# Patient Record
Sex: Male | Born: 1975
Health system: Southern US, Community
[De-identification: ages and names within clinical notes are randomized; demographics above are authoritative.]

## PROBLEM LIST (undated history)

## (undated) DIAGNOSIS — F329 Major depressive disorder, single episode, unspecified: Secondary | ICD-10-CM

## (undated) DIAGNOSIS — N433 Hydrocele, unspecified: Secondary | ICD-10-CM

## (undated) DIAGNOSIS — K219 Gastro-esophageal reflux disease without esophagitis: Secondary | ICD-10-CM

## (undated) DIAGNOSIS — F32A Depression, unspecified: Secondary | ICD-10-CM

## (undated) HISTORY — PX: NO PAST SURGERIES: SHX2092

## (undated) HISTORY — DX: Gastro-esophageal reflux disease without esophagitis: K21.9

---

## 2001-03-30 ENCOUNTER — Encounter: Admission: RE | Admit: 2001-03-30 | Discharge: 2001-03-30 | Payer: Self-pay | Admitting: *Deleted

## 2001-03-30 ENCOUNTER — Encounter: Payer: Self-pay | Admitting: *Deleted

## 2002-06-29 ENCOUNTER — Encounter: Payer: Self-pay | Admitting: Family Medicine

## 2002-06-29 ENCOUNTER — Ambulatory Visit (HOSPITAL_COMMUNITY): Admission: RE | Admit: 2002-06-29 | Discharge: 2002-06-29 | Payer: Self-pay | Admitting: Family Medicine

## 2011-04-21 ENCOUNTER — Emergency Department (HOSPITAL_COMMUNITY)
Admission: EM | Admit: 2011-04-21 | Discharge: 2011-04-21 | Disposition: A | Discharge status: Home / Self Care | Payer: Self-pay | Attending: Emergency Medicine | Admitting: Emergency Medicine

## 2011-04-21 DIAGNOSIS — R209 Unspecified disturbances of skin sensation: Secondary | ICD-10-CM | POA: Insufficient documentation

## 2011-04-21 DIAGNOSIS — R51 Headache: Secondary | ICD-10-CM | POA: Insufficient documentation

## 2011-04-21 DIAGNOSIS — J329 Chronic sinusitis, unspecified: Secondary | ICD-10-CM | POA: Insufficient documentation

## 2012-11-14 ENCOUNTER — Emergency Department (HOSPITAL_COMMUNITY)
Admission: EM | Admit: 2012-11-14 | Discharge: 2012-11-14 | Disposition: A | Discharge status: Home / Self Care | Payer: 59 | Attending: Emergency Medicine | Admitting: Emergency Medicine

## 2012-11-14 ENCOUNTER — Encounter (HOSPITAL_COMMUNITY): Payer: Self-pay

## 2012-11-14 DIAGNOSIS — R51 Headache: Secondary | ICD-10-CM

## 2012-11-14 DIAGNOSIS — Z8669 Personal history of other diseases of the nervous system and sense organs: Secondary | ICD-10-CM | POA: Insufficient documentation

## 2012-11-14 DIAGNOSIS — R040 Epistaxis: Secondary | ICD-10-CM

## 2012-11-14 DIAGNOSIS — F17200 Nicotine dependence, unspecified, uncomplicated: Secondary | ICD-10-CM | POA: Insufficient documentation

## 2012-11-14 DIAGNOSIS — Z79899 Other long term (current) drug therapy: Secondary | ICD-10-CM | POA: Insufficient documentation

## 2012-11-14 MED ORDER — OXYMETAZOLINE HCL 0.05 % NA SOLN: 2.0000 | Freq: Two times a day (BID) | NASAL | Status: DC

## 2012-11-14 NOTE — ED Notes (Signed)
Pt reports headache for 7 days, pain better today, nosebleed yesterday and is now better.

## 2012-11-14 NOTE — ED Provider Notes (Signed)
History   This chart was scribed for Thomas Razor, MD by Sofie Rower, ED Scribe. The patient was seen in room [facility] and the patient's care was started at 10:35AM.     CSN: [id number]  Arrival date & time 11/14/12  0944   First MD Initiated Contact with Patient 11/14/12 1035      Chief Complaint  Patient presents with  . Headache  . Epistaxis    (Consider location/radiation/quality/duration/timing/severity/associated sxs/prior treatment) Patient is a 36 y.o. male presenting with headaches and nosebleeds. The history is provided by the patient. No language interpreter was used.  Headache  This is a new problem. The current episode started more than 2 days ago (7 days ago). The problem occurs constantly. The problem has been gradually worsening. The headache is associated with nothing. The pain is located in the left unilateral region. The pain is moderate. The pain does not radiate. He has tried nothing for the symptoms. The treatment provided no relief.  Epistaxis  This is a new problem. The current episode started yesterday. The problem occurs rarely. The problem has been gradually improving. The problem is associated with an unknown factor. The bleeding has been from the left nare. He has tried nothing for the symptoms. The treatment provided no relief.    Thomas Hobbs is a 36 y.o. male , with a hx of sinus infection (diagnosed one month ago), who presents to the Emergency Department complaining of gradual, progressively worsening, headache, located at the left side of the head, onset one week ago.  Associated symptoms include epistaxis. The pt reports he has been experiencing a constant headache for the past week, culminating in an episode of epistaxis yesterday evening (11/13/12). The episode of epistaxis caused the pt to become concerned and generated a desire to seek evaluation at [facility] today. The pt informs that upon waking up on the morning, he experiences a light headache,  however, it gets progressively worse throughout the day.  The pt denies nausea and abdominal pain.   The pt is a current everyday smoker, in addition to drinking alcohol on an occasional basis.   PCP is Dr. Gerda Diss.    History reviewed. No pertinent past medical history.  History reviewed. No pertinent past surgical history.  No family history on file.  History  Substance Use Topics  . Smoking status: Current Every Day Smoker    Types: Cigarettes  . Smokeless tobacco: Not on file  . Alcohol Use: Yes     Comment: occ      Review of Systems  HENT: Positive for nosebleeds.   Neurological: Positive for headaches.  All other systems reviewed and are negative.    Allergies  Other  Home Medications   Current Outpatient Rx  Name  Route  Sig  Dispense  Refill  . AMOXICILLIN-POT CLAVULANATE 875-125 MG PO TABS   Oral   Take 1 tablet by mouth 2 (two) times daily. Started on Monday 11/09/12. Has had a couple of days in which only one dose was taken. For a 7 day supply         . GUAIFENESIN ER 600 MG PO TB12   Oral   Take 1,200 mg by mouth 2 (two) times daily as needed. For congestion         . NAPROXEN SODIUM 220 MG PO TABS   Oral   Take 220 mg by mouth 2 (two) times daily as needed. For headache         .  SERTRALINE HCL 100 MG PO TABS   Oral   Take 100 mg by mouth daily.           BP 125/63  Pulse 76  Temp 98.3 F (36.8 C) (Oral)  Resp 20  Ht 5\' 5"  (1.651 m)  Wt 155 lb (70.308 kg)  BMI 25.79 kg/m2  SpO2 100%  Physical Exam  Nursing note and vitals reviewed. Constitutional: He appears well-developed and well-nourished. No distress.  HENT:  Head: Normocephalic and atraumatic.       Dried blood within left nostril. No active bleeding.   Eyes: Conjunctivae normal are normal. Right eye exhibits no discharge. Left eye exhibits no discharge.  Neck: Neck supple.  Cardiovascular: Normal rate, regular rhythm and normal heart sounds.  Exam reveals no gallop  and no friction rub.   No murmur heard. Pulmonary/Chest: Effort normal and breath sounds normal. No respiratory distress.  Abdominal: Soft. He exhibits no distension. There is no tenderness.  Musculoskeletal: He exhibits no edema and no tenderness.  Neurological: He is alert.  Skin: Skin is warm and dry.  Psychiatric: He has a normal mood and affect. His behavior is normal. Thought content normal.    ED Course  Procedures (including critical care time)  DIAGNOSTIC STUDIES: Oxygen Saturation is 100% on room air, normal by my interpretation.    COORDINATION OF CARE:  1:13 PM- Treatment plan concerning application of nasal spary discussed with patient. Pt agrees with treatment.      Labs Reviewed - No data to display No results found.   1. Headache   2. Epistaxis       MDM  36 year old male with headache and epistaxis. Epistaxis has resolved without intervention.headache sounds like sinus congestion. Given course of Afrin which should help with both problems. Emergent return precautions were discussed. Outpatient followup otherwise to    I personally preformed the services scribed in my presence. The recorded information has been reviewed and considered. Thomas Razor, MD.     Thomas Razor, MD 11/19/12 [phone]

## 2013-02-12 ENCOUNTER — Ambulatory Visit (INDEPENDENT_AMBULATORY_CARE_PROVIDER_SITE_OTHER): Payer: 59 | Admitting: Nurse Practitioner

## 2013-02-12 ENCOUNTER — Encounter: Payer: Self-pay | Admitting: Nurse Practitioner

## 2013-02-12 VITALS — BP 136/84 | Wt 159.4 lb

## 2013-02-12 DIAGNOSIS — F418 Other specified anxiety disorders: Secondary | ICD-10-CM

## 2013-02-12 DIAGNOSIS — F341 Dysthymic disorder: Secondary | ICD-10-CM

## 2013-02-12 DIAGNOSIS — L259 Unspecified contact dermatitis, unspecified cause: Secondary | ICD-10-CM

## 2013-02-12 MED ORDER — SERTRALINE HCL 50 MG PO TABS: 50.0000 mg | ORAL_TABLET | Freq: Every day | ORAL | Status: DC

## 2013-02-12 MED ORDER — CLOBETASOL PROPIONATE 0.05 % EX CREA: TOPICAL_CREAM | Freq: Two times a day (BID) | CUTANEOUS | Status: DC

## 2013-02-12 MED ORDER — SERTRALINE HCL 25 MG PO TABS: 25.0000 mg | ORAL_TABLET | Freq: Every day | ORAL | Status: DC

## 2013-02-13 ENCOUNTER — Encounter: Payer: Self-pay | Admitting: Nurse Practitioner

## 2013-02-13 DIAGNOSIS — F418 Other specified anxiety disorders: Secondary | ICD-10-CM | POA: Insufficient documentation

## 2013-02-13 DIAGNOSIS — L259 Unspecified contact dermatitis, unspecified cause: Secondary | ICD-10-CM | POA: Insufficient documentation

## 2013-02-13 NOTE — Assessment & Plan Note (Signed)
Feel this is most likely due to contact dermatitis. Doubt infestation if no improvement with clobetasol twice a day after 7-10 days, patient to call back for Elimite cream.

## 2013-02-13 NOTE — Assessment & Plan Note (Signed)
Patient is currently on Zoloft 100 mg. Given prescription for Zoloft 50 mg, take this for 2-3 weeks. If no problems cut dose in half to 25 mg for 2-3 weeks. Given a second prescription for Zoloft 25 mg, if tolerated decrease to half a tab 12.5 mg for 2-3 weeks then discontinue. Patient understands that he still may have some symptoms of serotonin withdrawal syndrome but hopefully these will be minimized. Call back if any problems weaning off medication.

## 2013-02-13 NOTE — Progress Notes (Signed)
Subjective:  Presents for recheck of his depression and anxiety. Has been on Zoloft for years. Recently tried to wean himself off, was down to 50 mg when he stopped. Had intense reaction for couple of days, mentions that he broke a laptop. Went back on Zoloft 100 mg daily. Does not feel he needs the medication any longer. States she feels fine decreasing dosage. Would like to wean off medication at this point. No suicidal/homicidal thoughts or ideation. Also at end of visit mentions a slight rash around the ankle area after wearing a pair of boots during the snowstorm where he was sweating. Pruritic at times.  Objective:   BP 136/84  Wt 159 lb 6.4 oz (72.303 kg)  BMI 26.53 kg/m2 NAD. Alert, oriented. Calm cheerful affect. Lungs clear. Heart regular rate rhythm. A few scattered slightly pink dry areas noted on lower legs.

## 2013-04-14 ENCOUNTER — Telehealth: Payer: Self-pay | Admitting: Family Medicine

## 2013-04-14 NOTE — Telephone Encounter (Signed)
Patient was seen 02/12/2013 for a OV. He wanted message routed to [place] because he states she is monitoring his zoloft.

## 2013-04-14 NOTE — Telephone Encounter (Signed)
Patient would like a refill of his sertraline, but he would like to switch back to the 50 mg.. To [facility] in [place]

## 2013-04-14 NOTE — Telephone Encounter (Signed)
Will be glad to refill; need to know dosage; he was planning to wean off at last visit.

## 2013-04-14 NOTE — Telephone Encounter (Signed)
No. Pt has received multiple cards fr [place] in last four months last ov last June for short term illness,no ov for chronic concerns 22 months!!!! Needs ov

## 2013-04-15 ENCOUNTER — Telehealth: Payer: Self-pay | Admitting: *Deleted

## 2013-04-15 NOTE — Telephone Encounter (Signed)
Left msg to return call to clarify dosage amount of zoloft

## 2013-04-15 NOTE — Telephone Encounter (Signed)
Pt was taking Zoloft  25mg  QD, but had been taking 50mg  in the past. He is asking if he can have a refill on the 50mg .

## 2013-04-17 ENCOUNTER — Other Ambulatory Visit: Payer: Self-pay | Admitting: Nurse Practitioner

## 2013-04-19 ENCOUNTER — Other Ambulatory Visit: Payer: Self-pay | Admitting: Nurse Practitioner

## 2013-04-19 MED ORDER — SERTRALINE HCL 50 MG PO TABS: 50.0000 mg | ORAL_TABLET | Freq: Every day | ORAL | Status: DC

## 2013-04-19 NOTE — Telephone Encounter (Signed)
Refill on Zoloft 50 mg sent in to [facility]

## 2013-04-20 ENCOUNTER — Encounter: Payer: Self-pay | Admitting: Family Medicine

## 2013-04-20 ENCOUNTER — Ambulatory Visit (INDEPENDENT_AMBULATORY_CARE_PROVIDER_SITE_OTHER): Payer: 59 | Admitting: Family Medicine

## 2013-04-20 VITALS — BP 122/78 | Temp 98.7°F | Wt 156.0 lb

## 2013-04-20 DIAGNOSIS — R079 Chest pain, unspecified: Secondary | ICD-10-CM

## 2013-04-20 DIAGNOSIS — M2550 Pain in unspecified joint: Secondary | ICD-10-CM

## 2013-04-20 MED ORDER — MINOCYCLINE HCL 100 MG PO CAPS: 100.0000 mg | ORAL_CAPSULE | Freq: Two times a day (BID) | ORAL | Status: AC

## 2013-04-20 NOTE — Progress Notes (Signed)
  Subjective:    Patient ID: Thomas Hobbs, male    DOB: Apr 17, 1976, 37 y.o.   MRN: [id number]  HPI Strep in the family, both girls.  Pt got achey and sore throat, took abx for a sinus infxn. Took augmentin. f u cx was negative, felt better.  Diminished energy, light headed. No fever, tho felt like.  Muscle aches and joint aches.no tick bites, felt one on him. Not bitten  Muscle strenth a little better. achey in arm, off and on.  Felt panic attacks at night.  Chest pain, right ant semi sharp, constant, worse when leaning forward. Still smoking  Weaned down on zoloft to 25--got more anxious as numbers dropped. Review of Systems No vomiting measurable fever no change in bowel habits no known tick bite. Hasson 6 on no    Objective:   Physical Exam  Alert no acute distress HEENT normal. Vital stable. Lungs clear. Heart regular rate and rhythm. Abdomen soft. Flanged a bit during exam but otherwise completely normal no rebound no guarding. Skin no rash.      Assessment & Plan:  Impression multitude of symptoms likely related to #2 and #3 #2 panic attacks anxiety with element of serotonin withdrawal. Patient just increase his Zoloft #3 arthralgias myalgias diminished energy likely viral. However will cover with tetracycline to be safe. 25 minutes spent with patient most in discussion. Plan maintain higher dose of Zoloft. Add minocycline 100 mg twice a day. Symptomatic care discussed. WSL

## 2013-10-18 ENCOUNTER — Other Ambulatory Visit: Payer: Self-pay | Admitting: Nurse Practitioner

## 2014-01-08 ENCOUNTER — Other Ambulatory Visit: Payer: Self-pay | Admitting: Family Medicine

## 2014-02-10 ENCOUNTER — Encounter: Payer: Self-pay | Admitting: Family Medicine

## 2014-02-10 ENCOUNTER — Ambulatory Visit (INDEPENDENT_AMBULATORY_CARE_PROVIDER_SITE_OTHER): Payer: 59 | Admitting: Family Medicine

## 2014-02-10 VITALS — BP 136/82 | Temp 98.9°F | Ht 65.0 in | Wt 153.4 lb

## 2014-02-10 DIAGNOSIS — J019 Acute sinusitis, unspecified: Secondary | ICD-10-CM

## 2014-02-10 DIAGNOSIS — J111 Influenza due to unidentified influenza virus with other respiratory manifestations: Secondary | ICD-10-CM

## 2014-02-10 MED ORDER — AMOXICILLIN-POT CLAVULANATE 875-125 MG PO TABS: 1.0000 | ORAL_TABLET | Freq: Two times a day (BID) | ORAL | Status: DC

## 2014-02-10 MED ORDER — SERTRALINE HCL 50 MG PO TABS: ORAL_TABLET | ORAL | Status: DC

## 2014-02-10 NOTE — Progress Notes (Signed)
   Subjective:    Patient ID: Thomas Hobbs, male    DOB: 03/28/1976, 38 y.o.   MRN: [id number]  HPI Patient arrives to follow up from urgent care. Patient diagnosed with flu and given tamiflu. Fever is better but still having pressure in head and burning in chest. Patient relates using the urgent care diagnosed with the flu now he is having more frontal sinus headache pains and feels fatigued and tired PMH benign  Review of Systems  Constitutional: Negative for fever and activity change.  HENT: Positive for congestion and rhinorrhea. Negative for ear pain.   Eyes: Negative for discharge.  Respiratory: Positive for cough. Negative for wheezing.   Cardiovascular: Negative for chest pain.       Objective:   Physical Exam  Nursing note and vitals reviewed. Constitutional: He appears well-developed.  HENT:  Head: Normocephalic.  Mouth/Throat: Oropharynx is clear and moist. No oropharyngeal exudate.  Neck: Normal range of motion.  Cardiovascular: Normal rate, regular rhythm and normal heart sounds.   No murmur heard. Pulmonary/Chest: Effort normal and breath sounds normal. He has no wheezes.  Lymphadenopathy:    He has no cervical adenopathy.  Neurological: He exhibits normal muscle tone.  Skin: Skin is warm and dry.    Moderate frontal sinus tenderness      Assessment & Plan:  Influenza-finished Tamiflu no sign of pneumonia currently Rhinosinusitis antibiotics prescribed should gradually help this Patient is to rest through the weekend followup if fevers difficulty breathing or worse.

## 2014-02-16 ENCOUNTER — Encounter: Payer: Self-pay | Admitting: Family Medicine

## 2014-02-16 ENCOUNTER — Ambulatory Visit (INDEPENDENT_AMBULATORY_CARE_PROVIDER_SITE_OTHER): Payer: 59 | Admitting: Family Medicine

## 2014-02-16 VITALS — BP 116/86 | Temp 98.6°F | Ht 65.0 in | Wt 150.0 lb

## 2014-02-16 DIAGNOSIS — K219 Gastro-esophageal reflux disease without esophagitis: Secondary | ICD-10-CM

## 2014-02-16 DIAGNOSIS — J329 Chronic sinusitis, unspecified: Secondary | ICD-10-CM

## 2014-02-16 DIAGNOSIS — J310 Chronic rhinitis: Secondary | ICD-10-CM

## 2014-02-16 MED ORDER — CEFPROZIL 500 MG PO TABS: 500.0000 mg | ORAL_TABLET | Freq: Two times a day (BID) | ORAL | Status: DC

## 2014-02-16 MED ORDER — ONDANSETRON 4 MG PO TBDP: 4.0000 mg | ORAL_TABLET | Freq: Four times a day (QID) | ORAL | Status: DC | PRN

## 2014-02-16 NOTE — Progress Notes (Signed)
   Subjective:    Patient ID: Thomas Hobbs, male    DOB: 04/28/1976, 38 y.o.   MRN: [id number]  Emesis  This is a new problem. The current episode started in the past 7 days. The problem has been unchanged. The emesis has an appearance of stomach contents. Maximum temperature: 99.5. Associated symptoms include myalgias. Associated symptoms comments: Fatigue, loss of appetite, nausea. Treatments tried: augmentin. The treatment provided no relief.  Patient states that he has no other concerns at this time.   Nausea achey  diminishe dappetite  Temp low gr very small mon and last night  Took augmentin    not sure if aug adding to things  Loosening up finally in chest  Left side  Review of Systems  Gastrointestinal: Positive for vomiting.  Musculoskeletal: Positive for myalgias.   diminished energy somewhat diminished appetite ROS otherwise negative     Objective:   Physical Exam  Alert mild malaise. Hydration adequate. No orthostatic changes. H&T moderate his congestion lungs bronchial cough clear heart regular rhythm mild epigastric tenderness.      Assessment & Plan:  Impression post flu bronchitis sinusitis with GI element. Also Augmentin may be adding to reflux like symptoms. Plan stop Augmentin change antibiotic. Zofran when necessary for nausea. Take over-the-counter antacid encourage liquids expect slow resolution. WSL

## 2014-02-28 ENCOUNTER — Encounter: Payer: Self-pay | Admitting: Family Medicine

## 2014-02-28 ENCOUNTER — Ambulatory Visit (INDEPENDENT_AMBULATORY_CARE_PROVIDER_SITE_OTHER): Payer: 59 | Admitting: Family Medicine

## 2014-02-28 VITALS — BP 126/82 | Temp 99.0°F | Ht 64.0 in | Wt 150.0 lb

## 2014-02-28 DIAGNOSIS — J310 Chronic rhinitis: Secondary | ICD-10-CM

## 2014-02-28 DIAGNOSIS — J329 Chronic sinusitis, unspecified: Secondary | ICD-10-CM

## 2014-02-28 MED ORDER — LEVOFLOXACIN 500 MG PO TABS: 500.0000 mg | ORAL_TABLET | Freq: Every day | ORAL | Status: DC

## 2014-02-28 NOTE — Progress Notes (Signed)
   Subjective:    Patient ID: Thomas Hobbs, male    DOB: 05/27/1976, 38 y.o.   MRN: [id number]  Sinusitis This is a new problem. The current episode started 1 to 4 weeks ago. (Ringing in ears, headache, dizzy, pressure in head, shaky) Treatments tried: antibiotics.   Seen on 04/1. Prescribed cefzil. Took for 9 days and then started itching, red streak on face.   On day 8 or 9 developed sig itching on the back and legs  Took the med and developed itching and rash  Also had flushing after taking the meds  Usually does not get spring allergies  Stress level high, split up form spouse  Impacting on sleep etc, and maybe gave more stress Review of Systems No vomiting no diarrhea no rash minimal cough ROS otherwise negative    Objective:   Physical Exam  Alert hydration good. HEENT moderate his congestion. Plus minus frontal tenderness. Rashes dissipated pharynx normal neck supple. Lungs clear heart regular in rhythm.      Assessment & Plan:  #1 probable rhinosinusitis #2 probable Cefzil allergy discussed #3 stress level increase wi recent separation from spouseth plan Levaquin daily 10 days. Encouraged to cut down smoking. Regular exercise encourage.

## 2014-03-22 ENCOUNTER — Encounter: Payer: Self-pay | Admitting: Family Medicine

## 2014-03-22 ENCOUNTER — Ambulatory Visit (INDEPENDENT_AMBULATORY_CARE_PROVIDER_SITE_OTHER): Payer: 59 | Admitting: Family Medicine

## 2014-03-22 VITALS — BP 118/72 | Ht 64.0 in | Wt 152.2 lb

## 2014-03-22 DIAGNOSIS — H9319 Tinnitus, unspecified ear: Secondary | ICD-10-CM

## 2014-03-22 DIAGNOSIS — R251 Tremor, unspecified: Secondary | ICD-10-CM

## 2014-03-22 DIAGNOSIS — R259 Unspecified abnormal involuntary movements: Secondary | ICD-10-CM

## 2014-03-22 MED ORDER — SERTRALINE HCL 100 MG PO TABS: 100.0000 mg | ORAL_TABLET | Freq: Every day | ORAL | Status: DC

## 2014-03-22 MED ORDER — CLONAZEPAM 1 MG PO TABS: 1.0000 mg | ORAL_TABLET | Freq: Every day | ORAL | Status: DC

## 2014-03-22 MED ORDER — KETOCONAZOLE 2 % EX CREA: 1.0000 "application " | TOPICAL_CREAM | Freq: Two times a day (BID) | CUTANEOUS | Status: DC

## 2014-03-22 NOTE — Progress Notes (Signed)
   Subjective:    Patient ID: Thomas Hobbs States, male    DOB: 11/06/1976, 38 y.o.   MRN: [id number]  HPI  Patient arrives to discuss his nerves. Patient states he recently separated from wife and noticed he is having spells where his hands shake and he gets jittery and his ears ring. Patient stated he is having significant anxiety.  Very sig jitterine ess and shakiness, feels shaky.  Pt states occas gets anxious and nervous. Often has jumpiness and shakiness with this.  Major tremor with anxiety  Went thru counselling several yrs ago, husband "states that he needs to pack up and go" cause it "won't work".  Filed for full custody Hobbs few minutes ago.   Patient also reports Hobbs rash on his right thigh.   Review of Systems  headache no chest back pain no abdominal pain no change in bowel habits    Objective:   Physical Exam  Alert no apparent distress vitals stable lungs clear. Heart rare rhythm. Slightly tremulous on exam. Accentuated with focus. Tenia cruris evident.      Assessment & Plan:  Impression tremor with anxiety. #2 tenia corporis discussed plan appropriate intervention ketoconazole. Klonopin when necessary. Serotonin reuptake inhibitor diet exercise discussed. WSL

## 2014-03-28 ENCOUNTER — Telehealth: Payer: Self-pay | Admitting: Family Medicine

## 2014-03-28 NOTE — Telephone Encounter (Signed)
Patient says that the klonopin that he was prescribed for tremors helped him for the first two days. He said he took them at around 9pm and woke up feeling great, but since then he has been a nervous wreck. Please advise.  [facility] [place]

## 2014-03-28 NOTE — Telephone Encounter (Signed)
Patient scheduled office visit this week for re check

## 2014-03-28 NOTE — Telephone Encounter (Signed)
appt later this wk

## 2014-03-30 ENCOUNTER — Ambulatory Visit (INDEPENDENT_AMBULATORY_CARE_PROVIDER_SITE_OTHER): Payer: 59 | Admitting: Family Medicine

## 2014-03-30 ENCOUNTER — Encounter: Payer: Self-pay | Admitting: Family Medicine

## 2014-03-30 VITALS — BP 128/70 | Ht 64.0 in | Wt 151.8 lb

## 2014-03-30 DIAGNOSIS — G252 Other specified forms of tremor: Secondary | ICD-10-CM

## 2014-03-30 DIAGNOSIS — G250 Essential tremor: Secondary | ICD-10-CM

## 2014-03-30 MED ORDER — PROPRANOLOL HCL 40 MG PO TABS: 40.0000 mg | ORAL_TABLET | Freq: Two times a day (BID) | ORAL | Status: DC

## 2014-03-30 NOTE — Progress Notes (Signed)
   Subjective:    Patient ID: Thomas Hobbs, male    DOB: 10/11/1976, 38 y.o.   MRN: [id number]  HPIFollow up on Tremors. Taking klonopin. Not helping.   Primarily not helping.  Felt much better after klonopin. On the third day, has noted zero improvement  No sig notice of change with increased zoloft  Working ok, when trying to write has hard time writing.  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  Prior to major stresses of late--seperated--no sig tremor.  No known fam hx of tremor    Review of Systems No chest pain no headache no back pain no abdominal pain no loss of consciousness ROS otherwise negative    Objective:   Physical Exam Alert somewhat anxious appearing. HEENT normal. Lungs clear heart regular in rhythm. HEENT revealed fine tremor accentuated when spoken about       Assessment & Plan:  Impression essential tremor with exacerbation secondary to anxiety plan add propanolol 40 twice a day. Maintain Klonopin each bedtime when necessary for sleep. Symptomatic care discussed. Followup as scheduled. WSL

## 2014-03-31 DIAGNOSIS — G250 Essential tremor: Secondary | ICD-10-CM | POA: Insufficient documentation

## 2014-08-03 ENCOUNTER — Other Ambulatory Visit: Payer: Self-pay | Admitting: Family Medicine

## 2014-09-14 ENCOUNTER — Other Ambulatory Visit: Payer: Self-pay | Admitting: *Deleted

## 2014-09-14 ENCOUNTER — Telehealth: Payer: Self-pay | Admitting: Family Medicine

## 2014-09-14 MED ORDER — SERTRALINE HCL 100 MG PO TABS: ORAL_TABLET | ORAL | Status: DC

## 2014-09-14 NOTE — Telephone Encounter (Signed)
Med sent to pharm. Pt notified.  

## 2014-09-14 NOTE — Telephone Encounter (Signed)
Pt needs refill on his sertraline (ZOLOFT) 100 MG tablet, he has appointment here on 10/04/14 for a physical, can we give enough until then?  Pt uses [facility] Please call pt when done

## 2014-10-04 ENCOUNTER — Ambulatory Visit (INDEPENDENT_AMBULATORY_CARE_PROVIDER_SITE_OTHER): Payer: 59 | Admitting: Family Medicine

## 2014-10-04 ENCOUNTER — Encounter: Payer: Self-pay | Admitting: Family Medicine

## 2014-10-04 VITALS — BP 124/76 | HR 70 | Ht 64.5 in | Wt 153.0 lb

## 2014-10-04 DIAGNOSIS — Z23 Encounter for immunization: Secondary | ICD-10-CM

## 2014-10-04 DIAGNOSIS — L723 Sebaceous cyst: Secondary | ICD-10-CM

## 2014-10-04 DIAGNOSIS — Z0000 Encounter for general adult medical examination without abnormal findings: Secondary | ICD-10-CM

## 2014-10-04 MED ORDER — SERTRALINE HCL 100 MG PO TABS: ORAL_TABLET | ORAL | Status: DC

## 2014-10-04 NOTE — Progress Notes (Signed)
   Subjective:    Patient ID: Thomas Hobbs, male    DOB: 03/26/1976, 38 y.o.   MRN: [id number]  HPI The patient comes in today for a wellness visit.    A review of their health history was completed.  A review of medications was also completed.  Any needed refills; yes refill on zoloft  Eating habits: not health conscious  Falls/  MVA accidents in past few months: no  Regular exercise: none  Specialist pt sees on regular basis: none  Preventative health issues were discussed.   Additional concerns: none. flu vaccine today.  Not exercising  Diet not so good  Needs p e thru work  Kids doing good,  Smoking one and a half ppd,  Strong fam hx of cad, al smoked  Pt had sharp cp, and very tender to touch and sore, got better  On itds own  No colon ca no prost ca  Once per night  Took proptanolol for awhile did not seem to help  Klonopin now on ocas for sleep   Patient concerned that he gets multiple skin cysts. Frustrated by. Review of Systems  Constitutional: Negative for fever, activity change and appetite change.  HENT: Negative for congestion and rhinorrhea.   Eyes: Negative for discharge.  Respiratory: Negative for cough and wheezing.   Cardiovascular: Negative for chest pain.  Gastrointestinal: Negative for vomiting, abdominal pain and blood in stool.  Genitourinary: Negative for frequency and difficulty urinating.  Musculoskeletal: Negative for neck pain.  Skin: Negative for rash.  Allergic/Immunologic: Negative for environmental allergies and food allergies.  Neurological: Negative for weakness and headaches.  Psychiatric/Behavioral: Negative for agitation.  All other systems reviewed and are negative.      Objective:   Physical Exam  Constitutional: He appears well-developed and well-nourished.  HENT:  Head: Normocephalic and atraumatic.  Right Ear: External ear normal.  Left Ear: External ear normal.  Nose: Nose normal.  Mouth/Throat:  Oropharynx is clear and moist.  Eyes: EOM are normal. Pupils are equal, round, and reactive to light.  Neck: Normal range of motion. Neck supple. No thyromegaly present.  Cardiovascular: Normal rate, regular rhythm and normal heart sounds.   No murmur heard. Pulmonary/Chest: Effort normal and breath sounds normal. No respiratory distress. He has no wheezes.  Abdominal: Soft. Bowel sounds are normal. He exhibits no distension and no mass. There is no tenderness.  Genitourinary: Penis normal.  Musculoskeletal: Normal range of motion. He exhibits no edema.  Lymphadenopathy:    He has no cervical adenopathy.  Neurological: He is alert. He exhibits normal muscle tone.  Skin: Skin is warm and dry. No erythema.  Psychiatric: He has a normal mood and affect. His behavior is normal. Judgment normal.  Vitals reviewed.  Multiple sebaceous cyst noted on back groin. Some with recent inflammatory changes.       Assessment & Plan:  Impression #1wellness exam #2 depression clinically stable #3 frequent skin cystsplan appropriate blood work. Diet exercise discussed. Meds refilled. Check every 6 months. Dermatology referral. Maceo ProWSL

## 2014-10-11 LAB — HEPATIC FUNCTION PANEL
ALK PHOS: 100 U/L (ref 39–117)
ALT: 8 U/L (ref 0–53)
AST: 15 U/L (ref 0–37)
Albumin: 4.4 g/dL (ref 3.5–5.2)
BILIRUBIN DIRECT: 0.1 mg/dL (ref 0.0–0.3)
BILIRUBIN INDIRECT: 0.4 mg/dL (ref 0.2–1.2)
BILIRUBIN TOTAL: 0.5 mg/dL (ref 0.2–1.2)
Total Protein: 6.8 g/dL (ref 6.0–8.3)

## 2014-10-11 LAB — BASIC METABOLIC PANEL
BUN: 14 mg/dL (ref 6–23)
CO2: 27 meq/L (ref 19–32)
CREATININE: 0.78 mg/dL (ref 0.50–1.35)
Calcium: 9.2 mg/dL (ref 8.4–10.5)
Chloride: 104 mEq/L (ref 96–112)
Glucose, Bld: 89 mg/dL (ref 70–99)
Potassium: 4.3 mEq/L (ref 3.5–5.3)
SODIUM: 137 meq/L (ref 135–145)

## 2014-10-11 LAB — LIPID PANEL
CHOL/HDL RATIO: 5.4 ratio
CHOLESTEROL: 193 mg/dL (ref 0–200)
HDL: 36 mg/dL — ABNORMAL LOW (ref >39)
LDL Cholesterol: 132 mg/dL — ABNORMAL HIGH (ref 0–99)
Triglycerides: 126 mg/dL (ref <150)
VLDL: 25 mg/dL (ref 0–40)

## 2014-10-15 ENCOUNTER — Encounter: Payer: Self-pay | Admitting: Family Medicine

## 2014-11-02 ENCOUNTER — Encounter: Payer: Self-pay | Admitting: Family Medicine

## 2014-11-02 ENCOUNTER — Ambulatory Visit: Payer: 59 | Admitting: Family Medicine

## 2014-11-04 ENCOUNTER — Ambulatory Visit (INDEPENDENT_AMBULATORY_CARE_PROVIDER_SITE_OTHER): Payer: 59 | Admitting: Family Medicine

## 2014-11-04 ENCOUNTER — Encounter: Payer: Self-pay | Admitting: Family Medicine

## 2014-11-04 VITALS — BP 110/74 | Temp 98.4°F | Wt 155.0 lb

## 2014-11-04 DIAGNOSIS — K219 Gastro-esophageal reflux disease without esophagitis: Secondary | ICD-10-CM

## 2014-11-04 MED ORDER — PANTOPRAZOLE SODIUM 40 MG PO TBEC: 40.0000 mg | DELAYED_RELEASE_TABLET | Freq: Every day | ORAL | Status: DC

## 2014-11-04 NOTE — Progress Notes (Signed)
   Subjective:    Patient ID: Thomas Hobbs, male    DOB: 05/21/1976, 38 y.o.   MRN: [id number]  Chest Pain  This is a new problem. The current episode started 1 to 4 weeks ago. Associated symptoms include abdominal pain. Associated symptoms comments: bloating. Treatments tried: pepcid. The treatment provided moderate relief.    Heartburn for yrs uses pepcid complete, takes care of it  Recent constip  Sig belching and burping an consitpation  Noted midepigast pain off and on     Review of Systems  Cardiovascular: Positive for chest pain.  Gastrointestinal: Positive for abdominal pain.       Objective:   Physical Exam   Alert no acute distress vital stable lungs clear heart regular in rhythm abdomen mild epigastric tenderness     Assessment & Plan:  Impression reflux element of gastritis plan cut down caffeine. Local measures discussed. Protonix daily then when necessary. WSL

## 2014-11-05 DIAGNOSIS — K219 Gastro-esophageal reflux disease without esophagitis: Secondary | ICD-10-CM | POA: Insufficient documentation

## 2014-12-03 ENCOUNTER — Other Ambulatory Visit: Payer: Self-pay | Admitting: Family Medicine

## 2015-03-03 ENCOUNTER — Ambulatory Visit (INDEPENDENT_AMBULATORY_CARE_PROVIDER_SITE_OTHER): Payer: 59 | Admitting: Nurse Practitioner

## 2015-03-03 ENCOUNTER — Encounter: Payer: Self-pay | Admitting: Nurse Practitioner

## 2015-03-03 VITALS — BP 122/82 | Temp 98.2°F | Ht 64.5 in | Wt 157.4 lb

## 2015-03-03 DIAGNOSIS — J329 Chronic sinusitis, unspecified: Secondary | ICD-10-CM | POA: Diagnosis not present

## 2015-03-03 MED ORDER — AZITHROMYCIN 250 MG PO TABS: ORAL_TABLET | ORAL | Status: DC

## 2015-03-03 MED ORDER — METHYLPREDNISOLONE ACETATE 40 MG/ML IJ SUSP
40.0000 mg | Freq: Once | INTRAMUSCULAR | Status: AC
  Administered 2015-03-03: 40 mg via INTRAMUSCULAR

## 2015-03-06 ENCOUNTER — Encounter: Payer: Self-pay | Admitting: Nurse Practitioner

## 2015-03-06 NOTE — Progress Notes (Signed)
Subjective:  Presents for c/o congestion and cough x 11 d. Had some Doxycycline left over from previous visit. Has started this with no relief. Also using flonase. No fever. Sore throat resolved. Facial area headache. Green nasal drainage. Non productive cough. Ear pressure. Minimal wheezing.   Objective:   BP 122/82 mmHg  Temp(Src) 98.2 F (36.8 C) (Oral)  Ht 5' 4.5" (1.638 m)  Wt 157 lb 6.4 oz (71.396 kg)  BMI 26.61 kg/m2 NAD. Alert, oriented. TMs retracted, no erythema. Pharynx injected with green PND noted. Neck supple with mild anterior adenopathy. Lungs clear. Heart RRR.,  Assessment: Rhinosinusitis - Plan: methylPREDNISolone acetate (DEPO-MEDROL) injection 40 mg  Plan:  Meds ordered this encounter  Medications  . azithromycin (ZITHROMAX Z-PAK) 250 MG tablet    Sig: Take 2 tablets (500 mg) on  Day 1,  followed by 1 tablet (250 mg) once daily on Days 2 through 5.    Dispense:  6 each    Refill:  0    Order Specific Question:  Supervising Provider    Answer:  Merlyn AlbertLUKING, WILLIAM S [[id number]]  . methylPREDNISolone acetate (DEPO-MEDROL) injection 40 mg    Sig:    OTC meds as directed. Call back next week if no improvement, sooner if worse. Discussed smoking cessation.

## 2015-03-12 ENCOUNTER — Other Ambulatory Visit: Payer: Self-pay | Admitting: Family Medicine

## 2015-04-17 ENCOUNTER — Other Ambulatory Visit: Payer: Self-pay | Admitting: Family Medicine

## 2015-04-18 NOTE — Telephone Encounter (Signed)
Needs office visit.

## 2015-05-17 ENCOUNTER — Other Ambulatory Visit: Payer: Self-pay | Admitting: Family Medicine

## 2015-05-18 MED ORDER — SERTRALINE HCL 100 MG PO TABS: 100.0000 mg | ORAL_TABLET | Freq: Every day | ORAL | Status: DC

## 2015-05-18 NOTE — Telephone Encounter (Signed)
Needs office visit.

## 2015-05-18 NOTE — Addendum Note (Signed)
Addended by: Margaretha SheffieldBROWN, Mckyla Deckman S on: 05/18/2015 11:01 AM   Modules accepted: Orders

## 2015-06-15 ENCOUNTER — Other Ambulatory Visit: Payer: Self-pay | Admitting: Family Medicine

## 2015-07-16 ENCOUNTER — Other Ambulatory Visit: Payer: Self-pay | Admitting: Family Medicine

## 2015-07-17 NOTE — Telephone Encounter (Signed)
Ok two ref 

## 2015-08-28 ENCOUNTER — Encounter: Payer: Self-pay | Admitting: Family Medicine

## 2015-08-28 ENCOUNTER — Ambulatory Visit (INDEPENDENT_AMBULATORY_CARE_PROVIDER_SITE_OTHER): Payer: 59 | Admitting: Family Medicine

## 2015-08-28 VITALS — BP 124/76 | Temp 98.7°F | Ht 64.5 in | Wt 154.0 lb

## 2015-08-28 DIAGNOSIS — L03314 Cellulitis of groin: Secondary | ICD-10-CM | POA: Diagnosis not present

## 2015-08-28 MED ORDER — DOXYCYCLINE HYCLATE 100 MG PO TABS: 100.0000 mg | ORAL_TABLET | Freq: Two times a day (BID) | ORAL | Status: DC

## 2015-08-28 MED ORDER — MUPIROCIN 2 % EX OINT: TOPICAL_OINTMENT | CUTANEOUS | Status: DC

## 2015-08-28 NOTE — Progress Notes (Signed)
   Subjective:    Patient ID: Thomas Hobbs, male    DOB: 01/10/1976, 39 y.o.   MRN: [id number]  HPI Patient states that he has a cyst to left inner thigh near groin area. Patient states that it has drained some yellowish green pus.   Has hx of recurrent cysts  Saw dr hall in past and was offerred med but did not take  Has a knot and cyst on scrotal sack  Patient states that he also has rash in this area.  Review of Systems No fever no chills no change in urinary or bowel habits    Objective:   Physical Exam Alert vitals stable. Lungs clear heart regular in rhythm. Groin multiple inflamed cyst scrotal region inflamed cyst no fluctuance positive discharge tender erythema positive induration       Assessment & Plan:  Impression secondarily infected cyst plan local measures discussed. Abdomen Bactroban topically. Dr. see 100 twice a day 10 days. Symptom care discussed WSL

## 2015-10-17 ENCOUNTER — Telehealth: Payer: Self-pay | Admitting: Family Medicine

## 2015-10-17 DIAGNOSIS — Z13220 Encounter for screening for lipoid disorders: Secondary | ICD-10-CM

## 2015-10-17 DIAGNOSIS — Z139 Encounter for screening, unspecified: Secondary | ICD-10-CM

## 2015-10-17 DIAGNOSIS — Z79899 Other long term (current) drug therapy: Secondary | ICD-10-CM

## 2015-10-17 DIAGNOSIS — R5383 Other fatigue: Secondary | ICD-10-CM

## 2015-10-17 NOTE — Telephone Encounter (Signed)
Calling to get BW ordered for upcoming physical appointment.

## 2015-10-17 NOTE — Telephone Encounter (Signed)
Lipid, liver, CBC, metabolic 7

## 2015-10-18 ENCOUNTER — Other Ambulatory Visit: Payer: Self-pay | Admitting: Family Medicine

## 2015-10-18 NOTE — Telephone Encounter (Signed)
Orders ready. Pt notified.  

## 2015-10-18 NOTE — Telephone Encounter (Signed)
May have this and one refill, needs office visit for this specific medicine

## 2015-11-16 ENCOUNTER — Ambulatory Visit (INDEPENDENT_AMBULATORY_CARE_PROVIDER_SITE_OTHER): Payer: 59 | Admitting: Family Medicine

## 2015-11-16 ENCOUNTER — Ambulatory Visit (HOSPITAL_COMMUNITY)
Admission: RE | Admit: 2015-11-16 | Discharge: 2015-11-16 | Disposition: A | Discharge status: Home / Self Care | Payer: 59 | Source: Ambulatory Visit | Attending: Family Medicine | Admitting: Family Medicine

## 2015-11-16 ENCOUNTER — Encounter: Payer: Self-pay | Admitting: Family Medicine

## 2015-11-16 VITALS — BP 110/74 | Ht 65.0 in | Wt 153.4 lb

## 2015-11-16 DIAGNOSIS — Z13220 Encounter for screening for lipoid disorders: Secondary | ICD-10-CM | POA: Diagnosis not present

## 2015-11-16 DIAGNOSIS — F418 Other specified anxiety disorders: Secondary | ICD-10-CM

## 2015-11-16 DIAGNOSIS — M2550 Pain in unspecified joint: Secondary | ICD-10-CM

## 2015-11-16 DIAGNOSIS — Z79899 Other long term (current) drug therapy: Secondary | ICD-10-CM

## 2015-11-16 DIAGNOSIS — R109 Unspecified abdominal pain: Secondary | ICD-10-CM

## 2015-11-16 DIAGNOSIS — F329 Major depressive disorder, single episode, unspecified: Secondary | ICD-10-CM | POA: Diagnosis not present

## 2015-11-16 DIAGNOSIS — R5383 Other fatigue: Secondary | ICD-10-CM | POA: Diagnosis not present

## 2015-11-16 DIAGNOSIS — M79642 Pain in left hand: Secondary | ICD-10-CM | POA: Diagnosis present

## 2015-11-16 DIAGNOSIS — M79671 Pain in right foot: Secondary | ICD-10-CM | POA: Insufficient documentation

## 2015-11-16 DIAGNOSIS — Z0000 Encounter for general adult medical examination without abnormal findings: Secondary | ICD-10-CM | POA: Diagnosis not present

## 2015-11-16 DIAGNOSIS — F32A Depression, unspecified: Secondary | ICD-10-CM

## 2015-11-16 MED ORDER — SERTRALINE HCL 100 MG PO TABS: 100.0000 mg | ORAL_TABLET | Freq: Every day | ORAL | Status: DC

## 2015-11-16 NOTE — Progress Notes (Signed)
   Subjective:    Patient ID: Thomas Hobbs, male    DOB: 1976-01-22, 39 y.o.   MRN: [id number]  HPI The patient comes in today for a wellne  A review of their health history was completed.  A review of medications was also completed.  Any needed refills: zoloft. Medications reviewed today. Does not miss a dose. States overall its helping his depression. Does not do well when he skips a dose. Full compliance noted.  Eating habits: good  Falls/ MVA accidents in past few months: 1 fall (patient works on houses)   Regular exercise: no  Specialist pt sees on regular basis: none  Preventative health issues were discussed.   Additional concerns: Joint pain, muscle pain, weakness, sinus issues  Lot of exosure to mold and not feeling as good. Pressure sens in the head.  Takes zoloft faithfully, does nt think it is doing anything  Six months fatigue, work going well, not overtime   Exercise not regular, pt does not do that  Flu shot not given  Thyr hx in family, in mother , on supplentn  Watching fats in diet loves fats   Neg crippling arthr hx  Right habded      Review of Systems  Constitutional: Negative for fever, activity change and appetite change.  HENT: Negative for congestion and rhinorrhea.   Eyes: Negative for discharge.  Respiratory: Negative for cough and wheezing.   Cardiovascular: Negative for chest pain.  Gastrointestinal: Negative for vomiting, abdominal pain and blood in stool.  Genitourinary: Negative for frequency and difficulty urinating.  Musculoskeletal: Negative for neck pain.  Skin: Negative for rash.  Allergic/Immunologic: Negative for environmental allergies and food allergies.  Neurological: Negative for weakness and headaches.  Psychiatric/Behavioral: Negative for agitation.  All other systems reviewed and are negative.      Objective:   Physical Exam  Constitutional: He appears well-developed and well-nourished.  HENT:  Head:  Normocephalic and atraumatic.  Right Ear: External ear normal.  Left Ear: External ear normal.  Nose: Nose normal.  Mouth/Throat: Oropharynx is clear and moist.  Eyes: EOM are normal. Pupils are equal, round, and reactive to light.  Neck: Normal range of motion. Neck supple. No thyromegaly present.  Cardiovascular: Normal rate, regular rhythm and normal heart sounds.   No murmur heard. Pulmonary/Chest: Effort normal and breath sounds normal. No respiratory distress. He has no wheezes.  Abdominal: Soft. Bowel sounds are normal. He exhibits no distension and no mass. There is no tenderness.  Genitourinary: Penis normal.  Musculoskeletal: Normal range of motion. He exhibits no edema.  Lymphadenopathy:    He has no cervical adenopathy.  Neurological: He is alert. He exhibits normal muscle tone.  Skin: Skin is warm and dry. No erythema.  Psychiatric: He has a normal mood and affect. His behavior is normal. Judgment normal.  Vitals reviewed.         Assessment & Plan:  Impression 1 well until exam #2 depression clinically stable. Medications reviewed today. Importance of compliance discussed. Plan exercise diet discussed. Of note towards end of visit began to discuss myriad of problems including chronic right hip pain chronic epigastric discomfort achiness in joints and primarily left hand, and chronic headache. I advised we would do some initial labs x-rays etc. see patient for 10.visit in several weeks. Diet exercise discussed. Screening tests discussed WSL

## 2015-11-22 LAB — LIPID PANEL
Chol/HDL Ratio: 5.1 ratio — ABNORMAL HIGH (ref 0.0–5.0)
Cholesterol, Total: 213 mg/dL — ABNORMAL HIGH (ref 100–199)
HDL: 42 mg/dL
LDL Calculated: 154 mg/dL — ABNORMAL HIGH (ref 0–99)
Triglycerides: 86 mg/dL (ref 0–149)
VLDL Cholesterol Cal: 17 mg/dL (ref 5–40)

## 2015-11-22 LAB — SEDIMENTATION RATE: SED RATE: 2 mm/h (ref 0–15)

## 2015-11-22 LAB — CBC WITH DIFFERENTIAL/PLATELET
BASOS: 1 %
Basophils Absolute: 0.1 10*3/uL (ref 0.0–0.2)
EOS (ABSOLUTE): 0.4 10*3/uL (ref 0.0–0.4)
EOS: 6 %
HEMATOCRIT: 46.9 % (ref 37.5–51.0)
HEMOGLOBIN: 15.9 g/dL (ref 12.6–17.7)
IMMATURE GRANS (ABS): 0 10*3/uL (ref 0.0–0.1)
IMMATURE GRANULOCYTES: 1 %
Lymphocytes Absolute: 2.9 10*3/uL (ref 0.7–3.1)
Lymphs: 38 %
MCH: 30.1 pg (ref 26.6–33.0)
MCHC: 33.9 g/dL (ref 31.5–35.7)
MCV: 89 fL (ref 79–97)
MONOCYTES: 7 %
Monocytes Absolute: 0.5 10*3/uL (ref 0.1–0.9)
NEUTROS PCT: 47 %
Neutrophils Absolute: 3.5 10*3/uL (ref 1.4–7.0)
Platelets: 251 10*3/uL (ref 150–379)
RBC: 5.29 x10E6/uL (ref 4.14–5.80)
RDW: 13.5 % (ref 12.3–15.4)
WBC: 7.4 10*3/uL (ref 3.4–10.8)

## 2015-11-22 LAB — BASIC METABOLIC PANEL
BUN/Creatinine Ratio: 15 (ref 9–20)
BUN: 12 mg/dL (ref 6–24)
CO2: 25 mmol/L (ref 18–29)
CREATININE: 0.79 mg/dL (ref 0.76–1.27)
Calcium: 9.4 mg/dL (ref 8.7–10.2)
Chloride: 101 mmol/L (ref 96–106)
GFR calc Af Amer: 129 mL/min/{1.73_m2} (ref >59)
GFR, EST NON AFRICAN AMERICAN: 112 mL/min/{1.73_m2} (ref >59)
GLUCOSE: 88 mg/dL (ref 65–99)
Potassium: 4.3 mmol/L (ref 3.5–5.2)
Sodium: 138 mmol/L (ref 134–144)

## 2015-11-22 LAB — HEPATIC FUNCTION PANEL
ALBUMIN: 4.4 g/dL (ref 3.5–5.5)
ALT: 13 IU/L (ref 0–44)
AST: 20 IU/L (ref 0–40)
Alkaline Phosphatase: 97 IU/L (ref 39–117)
BILIRUBIN, DIRECT: 0.09 mg/dL (ref 0.00–0.40)
Bilirubin Total: 0.3 mg/dL (ref 0.0–1.2)
TOTAL PROTEIN: 6.4 g/dL (ref 6.0–8.5)

## 2015-11-22 LAB — HELICOBACTER PYLORI ABS-IGG+IGA, BLD: H Pylori IgG: <0.9 U/mL (ref 0.0–0.8)

## 2015-11-22 LAB — TSH: TSH: 2.09 u[IU]/mL (ref 0.450–4.500)

## 2015-11-22 LAB — RHEUMATOID FACTOR

## 2015-11-29 ENCOUNTER — Encounter: Payer: Self-pay | Admitting: Family Medicine

## 2015-11-29 ENCOUNTER — Ambulatory Visit (INDEPENDENT_AMBULATORY_CARE_PROVIDER_SITE_OTHER): Payer: 59 | Admitting: Family Medicine

## 2015-11-29 VITALS — BP 118/78 | Ht 65.0 in | Wt 158.6 lb

## 2015-11-29 DIAGNOSIS — K219 Gastro-esophageal reflux disease without esophagitis: Secondary | ICD-10-CM

## 2015-11-29 DIAGNOSIS — G44229 Chronic tension-type headache, not intractable: Secondary | ICD-10-CM

## 2015-11-29 DIAGNOSIS — M79671 Pain in right foot: Secondary | ICD-10-CM | POA: Diagnosis not present

## 2015-11-29 DIAGNOSIS — M79642 Pain in left hand: Secondary | ICD-10-CM

## 2015-11-29 MED ORDER — PANTOPRAZOLE SODIUM 40 MG PO TBEC: 40.0000 mg | DELAYED_RELEASE_TABLET | Freq: Every day | ORAL | Status: DC

## 2015-11-29 MED ORDER — CHLORZOXAZONE 500 MG PO TABS: ORAL_TABLET | ORAL | Status: DC

## 2015-11-29 NOTE — Patient Instructions (Addendum)
Results for orders placed or performed in visit on 11/16/15  Lipid panel  Result Value Ref Range   Cholesterol, Total 213 (H) 100 - 199 mg/dL   Triglycerides 86 0 - 149 mg/dL   HDL 42 >39 mg/dL   VLDL Cholesterol Cal 17 5 - 40 mg/dL   LDL Calculated 154 (H) 0 - 99 mg/dL   Chol/HDL Ratio 5.1 (H) 0.0 - 5.0 ratio units  Hepatic function panel  Result Value Ref Range   Total Protein 6.4 6.0 - 8.5 g/dL   Albumin 4.4 3.5 - 5.5 g/dL   Bilirubin Total 0.3 0.0 - 1.2 mg/dL   Bilirubin, Direct 0.09 0.00 - 0.40 mg/dL   Alkaline Phosphatase 97 39 - 117 IU/L   AST 20 0 - 40 IU/L   ALT 13 0 - 44 IU/L  Basic metabolic panel  Result Value Ref Range   Glucose 88 65 - 99 mg/dL   BUN 12 6 - 24 mg/dL   Creatinine, Ser 0.79 0.76 - 1.27 mg/dL   GFR calc non Af Amer 112 >59 mL/min/1.73   GFR calc Af Amer 129 >59 mL/min/1.73   BUN/Creatinine Ratio 15 9 - 20   Sodium 138 134 - 144 mmol/L   Potassium 4.3 3.5 - 5.2 mmol/L   Chloride 101 96 - 106 mmol/L   CO2 25 18 - 29 mmol/L   Calcium 9.4 8.7 - 10.2 mg/dL  CBC with Differential/Platelet  Result Value Ref Range   WBC 7.4 3.4 - 10.8 x10E3/uL   RBC 5.29 4.14 - 5.80 x10E6/uL   Hemoglobin 15.9 12.6 - 17.7 g/dL   Hematocrit 46.9 37.5 - 51.0 %   MCV 89 79 - 97 fL   MCH 30.1 26.6 - 33.0 pg   MCHC 33.9 31.5 - 35.7 g/dL   RDW 13.5 12.3 - 15.4 %   Platelets 251 150 - 379 x10E3/uL   Neutrophils 47 %   Lymphs 38 %   Monocytes 7 %   Eos 6 %   Basos 1 %   Neutrophils Absolute 3.5 1.4 - 7.0 x10E3/uL   Lymphocytes Absolute 2.9 0.7 - 3.1 x10E3/uL   Monocytes Absolute 0.5 0.1 - 0.9 x10E3/uL   EOS (ABSOLUTE) 0.4 0.0 - 0.4 x10E3/uL   Basophils Absolute 0.1 0.0 - 0.2 x10E3/uL   Immature Granulocytes 1 %   Immature Grans (Abs) 0.0 0.0 - 0.1 x10E3/uL  TSH  Result Value Ref Range   TSH 2.090 0.450 - 4.500 uIU/mL  Rheumatoid Factor  Result Value Ref Range   Rhuematoid fact SerPl-aCnc <10.0 0.0 - 13.9 IU/mL  Sed Rate (ESR)  Result Value Ref Range   Sed Rate  2 0 - 15 mm/hr  Helicobacter pylori abs-IgG+IgA, bld  Result Value Ref Range   H. pylori, IgA Abs <9.0 0.0 - 8.9 units   H Pylori IgG <0.9 0.0 - 0.8 U/mL   High Cholesterol High cholesterol refers to having a high level of cholesterol in your blood. Cholesterol is a white, waxy, fat-like protein that your body needs in small amounts. Your liver makes all the cholesterol you need. Excess cholesterol comes from the food you eat. Cholesterol travels in your bloodstream through your blood vessels. If you have high cholesterol, deposits (plaque) may build up on the walls of your blood vessels. This makes the arteries narrower and stiffer. Plaque increases your risk of heart attack and stroke. Work with your health care provider to keep your cholesterol levels in a healthy range. RISK FACTORS Several  things can make you more likely to have high cholesterol. These include:   Eating foods high in animal fat (saturated fat) or cholesterol.  Being overweight.  Not getting enough exercise.  Having a family history of high cholesterol. SIGNS AND SYMPTOMS High cholesterol does not cause symptoms. DIAGNOSIS  Your health care provider can do a blood test to check whether you have high cholesterol. If you are older than 20, your health care provider may check your cholesterol every 4-6 years. You may be checked more often if you already have high cholesterol or other risk factors for heart disease. The blood test for cholesterol measures the following:  Bad cholesterol (LDL cholesterol). This is the type of cholesterol that causes heart disease. This number should be less than 100.  Good cholesterol (HDL cholesterol). This type helps protect against heart disease. A healthy level of HDL cholesterol is 60 or higher.  Total cholesterol. This is the combined number of LDL cholesterol and HDL cholesterol. A healthy number is less than 200. TREATMENT  High cholesterol can be treated with diet changes,  lifestyle changes, and medicine.   Diet changes may include eating more whole grains, fruits, vegetables, nuts, and fish. You may also have to cut back on red meat and foods with a lot of added sugar.  Lifestyle changes may include getting at least 40 minutes of aerobic exercise three times a week. Aerobic exercises include walking, biking, and swimming. Aerobic exercise along with a healthy diet can help you maintain a healthy weight. Lifestyle changes may also include quitting smoking.  If diet and lifestyle changes are not enough to lower your cholesterol, your health care provider may prescribe a statin medicine. This medicine has been shown to lower cholesterol and also lower the risk of heart disease. HOME CARE INSTRUCTIONS  Only take over-the-counter or prescription medicines as directed by your health care provider.   Follow a healthy diet as directed by your health care provider. For instance:   Eat chicken (without skin), fish, veal, shellfish, ground [place] breast, and round or loin cuts of red meat.  Do not eat fried foods and fatty meats, such as hot dogs and salami.   Eat plenty of fruits, such as apples.   Eat plenty of vegetables, such as broccoli, potatoes, and carrots.   Eat beans, peas, and lentils.   Eat grains, such as barley, rice, couscous, and bulgur wheat.   Eat pasta without cream sauces.   Use skim or nonfat milk and low-fat or nonfat yogurt and cheeses. Do not eat or drink whole milk, cream, ice cream, egg yolks, and hard cheeses.   Do not eat stick margarine or tub margarines that contain trans fats (also called partially hydrogenated oils).   Do not eat cakes, cookies, crackers, or other baked goods that contain trans fats.   Do not eat saturated tropical oils, such as coconut and palm oil.   Exercise as directed by your health care provider. Increase your activity level with activities such as gardening or walking.   Keep all follow-up  appointments.  SEEK MEDICAL CARE IF:  You are struggling to maintain a healthy diet or weight.  You need help starting an exercise program.  You need help to stop smoking. SEEK IMMEDIATE MEDICAL CARE IF:  You have chest pain.  You have trouble breathing.   This information is not intended to replace advice given to you by your health care provider. Make sure you discuss any questions you have with  your health care provider.   Document Released: 11/04/2005 Document Revised: 11/25/2014 Document Reviewed: 08/27/2013 Elsevier Interactive Patient Education [employer].

## 2015-11-29 NOTE — Progress Notes (Signed)
Subjective:    Patient ID: Thomas Hobbs, male    DOB: 1976/03/12, 40 y.o.   MRN: [id number] Patient arrives for follow-up of tremendous number of concerns HPI  Patient arrives to follow up on headaches. Been getting them for year at least. 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 Uses naproxen or ibuprofen, sometimes does not help but usually does  Still having intermittent epigastric discomfrot ,feels a fullness and feels like a knot.worsens after eating . History of reflux. Does take anti-inflammatory meds as noted.  Clinching at work leads to pain in the hand but not doing excessive work outs . Pain primarily in the thumbs.   and discuss recent blood work and x rays.  Results for orders placed or performed in visit on 11/16/15  Lipid panel  Result Value Ref Range   Cholesterol, Total 213 (H) 100 - 199 mg/dL   Triglycerides 86 0 - 149 mg/dL   HDL 42 >39 mg/dL   VLDL Cholesterol Cal 17 5 - 40 mg/dL   LDL Calculated 154 (H) 0 - 99 mg/dL   Chol/HDL Ratio 5.1 (H) 0.0 - 5.0 ratio units  Hepatic function panel  Result Value Ref Range   Total Protein 6.4 6.0 - 8.5 g/dL   Albumin 4.4 3.5 - 5.5 g/dL   Bilirubin Total 0.3 0.0 - 1.2 mg/dL   Bilirubin, Direct 0.09 0.00 - 0.40 mg/dL   Alkaline Phosphatase 97 39 - 117 IU/L   AST 20 0 - 40 IU/L   ALT 13 0 - 44 IU/L  Basic metabolic panel  Result Value Ref Range   Glucose 88 65 - 99 mg/dL   BUN 12 6 - 24 mg/dL   Creatinine, Ser 0.79 0.76 - 1.27 mg/dL   GFR calc non Af Amer 112 >59 mL/min/1.73   GFR calc Af Amer 129 >59 mL/min/1.73   BUN/Creatinine Ratio 15 9 - 20   Sodium 138 134 - 144 mmol/L   Potassium 4.3 3.5 - 5.2 mmol/L   Chloride 101 96 - 106 mmol/L   CO2 25 18 - 29 mmol/L   Calcium 9.4 8.7 - 10.2 mg/dL  CBC with Differential/Platelet  Result Value Ref Range   WBC 7.4 3.4 - 10.8 x10E3/uL   RBC 5.29 4.14 - 5.80 x10E6/uL   Hemoglobin 15.9 12.6 - 17.7 g/dL   Hematocrit 46.9 37.5 - 51.0 %   MCV 89 79 - 97 fL   MCH 30.1 26.6 - 33.0 pg   MCHC 33.9 31.5 - 35.7 g/dL   RDW 13.5 12.3 - 15.4 %   Platelets 251 150 - 379 x10E3/uL   Neutrophils 47 %   Lymphs 38 %   Monocytes 7 %   Eos 6 %   Basos 1 %   Neutrophils Absolute 3.5 1.4 - 7.0 x10E3/uL   Lymphocytes Absolute 2.9 0.7 - 3.1 x10E3/uL   Monocytes Absolute 0.5 0.1 - 0.9 x10E3/uL   EOS (ABSOLUTE) 0.4 0.0 - 0.4 x10E3/uL   Basophils Absolute 0.1 0.0 - 0.2 x10E3/uL   Immature Granulocytes 1 %   Immature Grans (Abs) 0.0 0.0 - 0.1 x10E3/uL  TSH  Result Value Ref Range   TSH 2.090 0.450 - 4.500 uIU/mL  Rheumatoid Factor  Result Value Ref Range   Rhuematoid fact SerPl-aCnc <10.0 0.0 - 13.9 IU/mL  Sed Rate (ESR)  Result Value Ref Range   Sed Rate 2 0 -  15 mm/hr  Helicobacter pylori abs-IgG+IgA, bld  Result Value Ref Range   H. pylori, IgA Abs <9.0 0.0 - 8.9 units   H Pylori IgG <0.9 0.0 - 0.8 U/mL   Admits to dietary noncompliance. A lot of fatty greasy foods etc.  Right foot pain chronic basic metabolic first metacarpal phalangeal joint. No true gout like symptoms chronic pain nearly daily for the past year recalls no prior injury  Uses otc omeprazole Review of Systems No chest pain no back pain abdominal pain no change in bowel habits review systems otherwise negative    Objective:   Physical Exam  Alert vital stable neck supple HEENT normal lungs clear heart rare rhythm mild epigastric tenderness hands good grip no deformities right foot some pain deep palpation first metacarpal phalangeal joint      Assessment & Plan:  Impression tension headaches discussed at length regular exercise stress reduction advised add chlorzoxazone when necessary #2 reflux with now no minimal gastritis discussed will increase her Protonix daily try to cut back on anti-inflammatories No. 3 foot pain likely arthritis she x-ray result #4 hand pain likely tendinitis  work-related symptom care discussed plan chlorzoxazone prescribed. Protonic prescribed exercise encourage encouraged to cut down on fat intake WSL

## 2015-12-07 ENCOUNTER — Telehealth: Payer: Self-pay | Admitting: Family Medicine

## 2015-12-07 NOTE — Telephone Encounter (Signed)
Patient has had diarrhea, stomach pains, gas, slight nausea since Monday.  Can we call something in to help his diarrhea?      [facility]

## 2015-12-07 NOTE — Telephone Encounter (Signed)
Discussed with patient. Patient advised otc immodium is al that is rec for this. Patient verbalized understanding and will call back to schedule appt.if ongoing problems.

## 2015-12-07 NOTE — Telephone Encounter (Signed)
otc immodium is al that is rec for this

## 2015-12-08 ENCOUNTER — Ambulatory Visit (INDEPENDENT_AMBULATORY_CARE_PROVIDER_SITE_OTHER): Payer: 59 | Admitting: Nurse Practitioner

## 2015-12-08 ENCOUNTER — Encounter: Payer: Self-pay | Admitting: Nurse Practitioner

## 2015-12-08 VITALS — BP 120/80 | Temp 98.6°F | Ht 65.0 in | Wt 154.2 lb

## 2015-12-08 DIAGNOSIS — R197 Diarrhea, unspecified: Secondary | ICD-10-CM | POA: Diagnosis not present

## 2015-12-08 NOTE — Patient Instructions (Signed)
Bowel probiotic: align as directed

## 2015-12-10 ENCOUNTER — Encounter: Payer: Self-pay | Admitting: Nurse Practitioner

## 2015-12-10 NOTE — Progress Notes (Signed)
Subjective:  Presents for c/o intermittent diarrhea for the past 5 days. Had frequent diarrhea the first day 1/16; gradually improved then watery again yesterday; more formed today; has had 2 stools. No fever, some chills. No blood in stool. Mild mid upper epigastric area discomfort at times. Started on Protonix on 1/11 for acid reflux which has been stable. Slight nausea, no vomiting. Taking fluids well. Voiding nl. Smoker. Drinks a large amount of caffeine. No alcohol or NSAID use. Has been eating a regular diet this week including greasy foods.   Objective:   BP 120/80 mmHg  Temp(Src) 98.6 F (37 C) (Oral)  Ht  (1.651 m)  Wt 154 lb 4 oz (69.967 kg)  BMI 25.67 kg/m2 NAD. Alert, oriented. Lungs clear. Heart RRR. Abdomen soft, non distended, with active BS x 4. Minimal epigastric area tenderness going into RUQ. No obvious masses. No rebound or guarding. Labs dated 11/17/15 show normal liver enzymes.   Assessment: Diarrhea, unspecified type  Plan: start bowel probiotic. Reviewed dietary measures for diarrhea. Encouraged patient to cut back on caffeine and tobacco use. Continue Protonix as directed. Warning signs reviewed. Call back in 4-5 days if no improvement, sooner if worse.

## 2016-01-02 ENCOUNTER — Telehealth: Payer: Self-pay | Admitting: Family Medicine

## 2016-01-02 NOTE — Telephone Encounter (Signed)
Ov later this wk to foucuas only on abdomen

## 2016-01-02 NOTE — Telephone Encounter (Signed)
Spoke with patient and to discuss symptoms. Patient has c/o of abdominal pain below sternum radiating to left and right sides of abdomen, tender to the touch, excessive gas, and states he has weird growling sound from stomach. Bowel movements are normal. Patient states that he has been taking Protonix as prescribed but is still having the same discomfort. Would like to know what is recommended next. Please advise?

## 2016-01-02 NOTE — Telephone Encounter (Signed)
Spoke with patient and informed him per Dr.Steve Luking- Office visit later this week to focus only on abdominal pain. Patient verbalized understanding and was transferred to front desk to schedule appointment.

## 2016-01-02 NOTE — Telephone Encounter (Signed)
Pt called stating that he is still having problems with his stomach and is wanting to know if he should get an ultrasound or something to see if there is anything else that can be done. Please advise.

## 2016-01-03 ENCOUNTER — Encounter: Payer: Self-pay | Admitting: Family Medicine

## 2016-01-03 ENCOUNTER — Ambulatory Visit (INDEPENDENT_AMBULATORY_CARE_PROVIDER_SITE_OTHER): Payer: 59 | Admitting: Family Medicine

## 2016-01-03 VITALS — BP 122/80 | Temp 98.6°F | Ht 64.0 in | Wt 159.0 lb

## 2016-01-03 DIAGNOSIS — R197 Diarrhea, unspecified: Secondary | ICD-10-CM | POA: Diagnosis not present

## 2016-01-03 DIAGNOSIS — K219 Gastro-esophageal reflux disease without esophagitis: Secondary | ICD-10-CM | POA: Diagnosis not present

## 2016-01-03 DIAGNOSIS — R109 Unspecified abdominal pain: Secondary | ICD-10-CM

## 2016-01-03 NOTE — Progress Notes (Signed)
   Subjective:    Patient ID: Thomas Hobbs, male    DOB: [birth year], 40 y.o.   MRN: [id number]  Abdominal Pain This is a recurrent problem. The pain is located in the epigastric region. Nothing aggravates the pain. The pain is relieved by nothing.   Diarrhea has calmed down  Reflux is better  On zoloft long term no gi symptoms number has caused problem in the past.  Notes persistent luq tenderness and right mid abd tend  pts wondering if more than just gastritis and esophagit  Often accompanied by sustantial gas  No major change in diet, pt had spicey foods and did not chang e the discomfor   Patient has smoked for 30 years worried about more potential serious etiology to his pain.  Notes ongoing stress.  No fever or chills. Pain somewhat worse post prandially can occur any time  Review of Systems  Gastrointestinal: Positive for abdominal pain.   negative urine symptoms. No dysuria no chest pain ROS otherwise negative     Objective:   Physical Exam  Alert vital stable HEENT normal. Lungs clear. Heart rare rhythm abdomen excellent bowel sounds mild epigastric tenderness no rebound no guarding no masses      Assessment & Plan:  Impression long-standing abdominal pain now worsening. Long discussion held. Minimal responsive proton pump inhibitors patient worried about something more serious with his progressive pain plan GI referral rationale discussed 25 minutes spent most in discussion maintain same meds of note negative for H. pylori

## 2016-01-08 ENCOUNTER — Encounter: Payer: Self-pay | Admitting: Family Medicine

## 2016-01-09 ENCOUNTER — Encounter (INDEPENDENT_AMBULATORY_CARE_PROVIDER_SITE_OTHER): Payer: Self-pay | Admitting: *Deleted

## 2016-02-02 ENCOUNTER — Ambulatory Visit (INDEPENDENT_AMBULATORY_CARE_PROVIDER_SITE_OTHER): Payer: 59 | Admitting: Internal Medicine

## 2016-02-27 ENCOUNTER — Encounter: Payer: Self-pay | Admitting: Family Medicine

## 2016-02-27 ENCOUNTER — Ambulatory Visit (INDEPENDENT_AMBULATORY_CARE_PROVIDER_SITE_OTHER): Payer: 59 | Admitting: Family Medicine

## 2016-02-27 VITALS — BP 134/86 | Temp 98.5°F | Ht 64.0 in | Wt 160.0 lb

## 2016-02-27 DIAGNOSIS — J329 Chronic sinusitis, unspecified: Secondary | ICD-10-CM | POA: Diagnosis not present

## 2016-02-27 MED ORDER — CLARITHROMYCIN 500 MG PO TABS
500.0000 mg | ORAL_TABLET | Freq: Two times a day (BID) | ORAL | Status: AC
Start: 2016-02-27 — End: 2016-03-12

## 2016-02-27 NOTE — Progress Notes (Signed)
   Subjective:    Patient ID: Thomas Hobbs, male    DOB: 06/19/1976, 40 y.o.   MRN: [id number]  HPISinus burning after using saline solution. Happened 14 hours ago.   Month ago started getting cold and sinus infxn an d other symptoms  [facility], [facility] not [facility]x  sinuxs pressure since then  Usually uses saline  Bad irritation and burning after using the nasal saline  Still has pressure  Usually gets sl cold in thre spring   Had systemic symptoms early on         Review of Systems No headache, no major weight loss or weight gain, no chest pain no back pain abdominal pain no change in bowel habits complete ROS otherwise negative     Objective:   Physical Exam   Alert, mild malaise. Hydration good Vitals stable. frontal/ maxillary tenderness evident positive nasal congestion. pharynx normal neck supple  lungs clear/no crackles or wheezes. heart regular in rhythm      Assessment & Plan:  Impression rhinosinusitis likely post viral, discussed with patient. plan antibiotics prescribed. Questions answered. Symptomatic care discussed. warning signs discussed. WSL

## 2016-05-15 ENCOUNTER — Other Ambulatory Visit: Payer: Self-pay | Admitting: Family Medicine

## 2016-05-22 ENCOUNTER — Encounter (INDEPENDENT_AMBULATORY_CARE_PROVIDER_SITE_OTHER): Payer: Self-pay | Admitting: *Deleted

## 2016-05-22 ENCOUNTER — Ambulatory Visit (INDEPENDENT_AMBULATORY_CARE_PROVIDER_SITE_OTHER): Payer: 59 | Admitting: Internal Medicine

## 2016-05-22 ENCOUNTER — Encounter (INDEPENDENT_AMBULATORY_CARE_PROVIDER_SITE_OTHER): Payer: Self-pay | Admitting: Internal Medicine

## 2016-05-22 ENCOUNTER — Other Ambulatory Visit (INDEPENDENT_AMBULATORY_CARE_PROVIDER_SITE_OTHER): Payer: Self-pay | Admitting: Internal Medicine

## 2016-05-22 VITALS — BP 90/50 | HR 60 | Temp 98.0°F | Ht 64.0 in | Wt 156.5 lb

## 2016-05-22 DIAGNOSIS — K219 Gastro-esophageal reflux disease without esophagitis: Secondary | ICD-10-CM

## 2016-05-22 NOTE — Progress Notes (Signed)
   Subjective:    Patient ID: Thomas Hobbs, male    DOB: 01/20/1976, 40 y.o.   MRN: [id number]  HPI Referred by Dr. Lubertha SouthSteve Luking for epigastric pain. He has had the pain x 1 year off and on.  The epigastric pain occurs in cycles.  For the past 2-3 weeks the pain has been worse.  Foods does not make it worse.  He has acid reflux 4-5 times a week. He burps frequently. At times he has stomach acid to come up into his mouth. He usually has a BM x 1 day . He says he is regular.  For the most part, his appetite is good.  He tells me he had a black stool x 1 last week when his stomach was bothering him.  No NSAIDs.      Review of Systems Past Medical History  Diagnosis Date  . GERD (gastroesophageal reflux disease)     No past surgical history on file.  Allergies  Allergen Reactions  . Augmentin [Amoxicillin-Pot Clavulanate] Nausea Only  . Cefzil [Cefprozil] Itching  . Levaquin [Levofloxacin In D5w]     Chest tightness, dizziness, lightheaded, myalgia  . Other Other (See Comments)    Patient states he is allergic to an antibiotic, unsure of name - possibly clindamycin (numbness and swelling to one side of face)    Current Outpatient Prescriptions on File Prior to Visit  Medication Sig Dispense Refill  . pantoprazole (PROTONIX) 40 MG tablet Take 1 tablet (40 mg total) by mouth daily. 30 tablet 5  . sertraline (ZOLOFT) 100 MG tablet TAKE 1 TABLET(100 MG) BY MOUTH DAILY 30 tablet 0   No current facility-administered medications on file prior to visit.        Objective:   Physical ExamBlood pressure 90/50, pulse 60, temperature 98 F (36.7 C), height 5\' 4"  (1.626 m), weight 156 lb 8 oz (70.988 kg). Alert and oriented. Skin warm and dry. Oral mucosa is moist.   . Sclera anicteric, conjunctivae is pink. Thyroid not enlarged. No cervical lymphadenopathy. Lungs clear. Heart regular rate and rhythm.  Abdomen is soft. Bowel sounds are positive. No hepatomegaly. No abdominal masses  felt. No tenderness.  No edema to lower extremities.          Assessment & Plan:  GERD.  PUD needs to be ruled out.  EGD. The risks and benefits such as perforation, bleeding, and infection were reviewed with the patient and is agreeable.

## 2016-05-22 NOTE — Patient Instructions (Signed)
The risks and benefits such as perforation, bleeding, and infection were reviewed with the patient and is agreeable. 

## 2016-05-23 ENCOUNTER — Other Ambulatory Visit: Payer: Self-pay | Admitting: Family Medicine

## 2016-06-10 ENCOUNTER — Ambulatory Visit (INDEPENDENT_AMBULATORY_CARE_PROVIDER_SITE_OTHER): Payer: 59 | Admitting: Family Medicine

## 2016-06-10 ENCOUNTER — Encounter: Payer: Self-pay | Admitting: Family Medicine

## 2016-06-10 VITALS — BP 122/82 | Ht 64.0 in | Wt 156.6 lb

## 2016-06-10 DIAGNOSIS — N433 Hydrocele, unspecified: Secondary | ICD-10-CM

## 2016-06-10 NOTE — Patient Instructions (Signed)
Hydrocele, Adult  A hydrocele is a collection of fluid in the loose pouch of skin that holds the testicles (scrotum). Usually, it affects only one testicle.  CAUSES  This condition may be caused by:  · An injury to the scrotum.  · An infection.  · A tumor or cancer of the testicle.  · Twisting of a testicle.  · Decreased blood flow to the scrotum.  SYMPTOMS  A hydrocele feels like a water-filled balloon. It may also feel heavy. A hydrocele can cause:  · Swelling of the scrotum. The swelling may decrease when you lie down.  · Swelling of the groin.  · Mild discomfort in the scrotum.  · Pain. This can develop if the hydrocele was caused by infection or twisting.  DIAGNOSIS  This condition may be diagnosed with a medical history, physical exam, and imaging tests. You may also have blood and urine tests to check for infection.  TREATMENT  Treatment may include:  · Watching and waiting, particularly if the hydrocele causes no symptoms.  · Treatment of the underlying condition. This may include using antibiotic medicine.  · Surgery to drain the fluid. Some surgical options include:    Needle aspiration. For this procedure, a needle is used to drain fluid.    Hydrocelectomy. For this procedure, an incision is made in the scrotum to remove the fluid sac.  HOME CARE INSTRUCTIONS  · Keep all follow-up visits as told by your health care provider. This is important.  · Watch the hydrocele for any changes.  · Take over-the-counter and prescription medicines only as told by your health care provider.  · If you were prescribed an antibiotic medicine, use it as told by your health care provider. Do not stop using the antibiotic even if your condition improves.  SEEK MEDICAL CARE IF:  · The swelling in your scrotum or groin gets worse.  · The hydrocele becomes red, firm, tender to the touch, or painful.  · You notice any changes in the hydrocele.  · You have a fever.     This information is not intended to replace advice given to  you by your health care provider. Make sure you discuss any questions you have with your health care provider.     Document Released: 04/24/2010 Document Revised: 03/21/2015 Document Reviewed: 10/31/2014  Elsevier Interactive Patient Education ©2016 [employer]

## 2016-06-10 NOTE — Progress Notes (Signed)
   Subjective:    Patient ID: Thomas Hobbs, male    DOB: 06-Apr-1976, 40 y.o.   MRN: [id number]  HPI  Patient arrives with enlarged testicle-first noticed on Friday but unsure exactly how long it has been going on. Patient reports no pain.  Feels slight pressure  No dysuria no pain  [phone]  Allied urology, preferred to do in house u s  Review of Systems No headache, no major weight loss or weight gain, no chest pain no back pain abdominal pain no change in bowel habits complete ROS otherwise negative     Objective:   Physical Exam   Alert vitals stable, NAD. Blood pressure good on repeat. HEENT normal. Lungs clear. Heart regular rate and rhythm. Right testicle hydrocele palpated. No obvious inguinal mass.     Assessment & Plan:  Impression spontaneous hydrocele plan urology referral rationale discussed [facility]

## 2016-06-11 ENCOUNTER — Encounter: Payer: Self-pay | Admitting: Family Medicine

## 2016-06-17 ENCOUNTER — Other Ambulatory Visit: Payer: Self-pay | Admitting: Family Medicine

## 2016-07-02 ENCOUNTER — Other Ambulatory Visit: Payer: Self-pay | Admitting: Urology

## 2016-07-02 ENCOUNTER — Ambulatory Visit (INDEPENDENT_AMBULATORY_CARE_PROVIDER_SITE_OTHER): Payer: 59 | Admitting: Urology

## 2016-07-02 ENCOUNTER — Ambulatory Visit (HOSPITAL_COMMUNITY)
Admission: RE | Admit: 2016-07-02 | Discharge: 2016-07-02 | Disposition: A | Discharge status: Home / Self Care | Payer: 59 | Source: Ambulatory Visit | Attending: Urology | Admitting: Urology

## 2016-07-02 DIAGNOSIS — N433 Hydrocele, unspecified: Secondary | ICD-10-CM

## 2016-07-14 ENCOUNTER — Encounter (HOSPITAL_COMMUNITY): Payer: Self-pay | Admitting: Emergency Medicine

## 2016-07-14 ENCOUNTER — Emergency Department (HOSPITAL_COMMUNITY)
Admission: EM | Admit: 2016-07-14 | Discharge: 2016-07-14 | Disposition: A | Discharge status: Home / Self Care | Payer: 59 | Attending: Dermatology | Admitting: Dermatology

## 2016-07-14 DIAGNOSIS — Z5321 Procedure and treatment not carried out due to patient leaving prior to being seen by health care provider: Secondary | ICD-10-CM | POA: Diagnosis not present

## 2016-07-14 DIAGNOSIS — F17210 Nicotine dependence, cigarettes, uncomplicated: Secondary | ICD-10-CM | POA: Diagnosis not present

## 2016-07-14 DIAGNOSIS — N433 Hydrocele, unspecified: Secondary | ICD-10-CM | POA: Diagnosis not present

## 2016-07-14 NOTE — ED Notes (Signed)
NO ANSWER WHEN CALLED TO PATIENT ROOM

## 2016-07-14 NOTE — ED Triage Notes (Signed)
Reports has been dx with right sided hydrocele and has been having increased pain over the last 3 days into lower abdomen.  Denies increased swelling.

## 2016-07-16 ENCOUNTER — Telehealth: Payer: Self-pay | Admitting: Family Medicine

## 2016-07-16 ENCOUNTER — Other Ambulatory Visit: Payer: Self-pay | Admitting: Family Medicine

## 2016-07-16 NOTE — Telephone Encounter (Signed)
Patient called and asked if we could aspirate his hydrocele.  I advised him to call his Urologist as we do not do that here.

## 2016-07-23 ENCOUNTER — Other Ambulatory Visit: Payer: Self-pay | Admitting: Family Medicine

## 2016-07-24 ENCOUNTER — Encounter (HOSPITAL_COMMUNITY)
Admission: RE | Disposition: A | Discharge status: Home / Self Care | Payer: Self-pay | Source: Ambulatory Visit | Attending: Internal Medicine

## 2016-07-24 ENCOUNTER — Encounter (HOSPITAL_COMMUNITY): Payer: Self-pay | Admitting: *Deleted

## 2016-07-24 ENCOUNTER — Ambulatory Visit (HOSPITAL_COMMUNITY)
Admission: RE | Admit: 2016-07-24 | Discharge: 2016-07-24 | Disposition: A | Discharge status: Home / Self Care | Payer: 59 | Source: Ambulatory Visit | Attending: Internal Medicine | Admitting: Internal Medicine

## 2016-07-24 DIAGNOSIS — F329 Major depressive disorder, single episode, unspecified: Secondary | ICD-10-CM | POA: Diagnosis not present

## 2016-07-24 DIAGNOSIS — K219 Gastro-esophageal reflux disease without esophagitis: Secondary | ICD-10-CM

## 2016-07-24 DIAGNOSIS — R1013 Epigastric pain: Secondary | ICD-10-CM | POA: Insufficient documentation

## 2016-07-24 DIAGNOSIS — K449 Diaphragmatic hernia without obstruction or gangrene: Secondary | ICD-10-CM | POA: Insufficient documentation

## 2016-07-24 DIAGNOSIS — K2950 Unspecified chronic gastritis without bleeding: Secondary | ICD-10-CM | POA: Diagnosis not present

## 2016-07-24 DIAGNOSIS — F17210 Nicotine dependence, cigarettes, uncomplicated: Secondary | ICD-10-CM | POA: Diagnosis not present

## 2016-07-24 DIAGNOSIS — K228 Other specified diseases of esophagus: Secondary | ICD-10-CM | POA: Diagnosis not present

## 2016-07-24 DIAGNOSIS — Z79899 Other long term (current) drug therapy: Secondary | ICD-10-CM | POA: Insufficient documentation

## 2016-07-24 DIAGNOSIS — K2970 Gastritis, unspecified, without bleeding: Secondary | ICD-10-CM | POA: Diagnosis not present

## 2016-07-24 HISTORY — DX: Major depressive disorder, single episode, unspecified: F32.9

## 2016-07-24 HISTORY — PX: ESOPHAGOGASTRODUODENOSCOPY: SHX5428

## 2016-07-24 HISTORY — DX: Hydrocele, unspecified: N43.3

## 2016-07-24 HISTORY — PX: BIOPSY: SHX5522

## 2016-07-24 HISTORY — DX: Depression, unspecified: F32.A

## 2016-07-24 SURGERY — EGD (ESOPHAGOGASTRODUODENOSCOPY)
Anesthesia: Moderate Sedation

## 2016-07-24 MED ORDER — BUTAMBEN-TETRACAINE-BENZOCAINE 2-2-14 % EX AERO
INHALATION_SPRAY | CUTANEOUS | Status: DC | PRN
  Administered 2016-07-24: 1 via TOPICAL

## 2016-07-24 MED ORDER — SODIUM CHLORIDE 0.9% FLUSH
INTRAVENOUS | Status: AC
  Filled 2016-07-24: qty 10

## 2016-07-24 MED ORDER — MEPERIDINE HCL 50 MG/ML IJ SOLN
INTRAMUSCULAR | Status: DC | PRN
  Administered 2016-07-24 (×2): 25 mg via INTRAVENOUS

## 2016-07-24 MED ORDER — SODIUM CHLORIDE 0.9 % IV SOLN
INTRAVENOUS | Status: DC
  Administered 2016-07-24: 12:00:00 via INTRAVENOUS

## 2016-07-24 MED ORDER — MIDAZOLAM HCL 5 MG/5ML IJ SOLN
INTRAMUSCULAR | Status: DC | PRN
  Administered 2016-07-24 (×4): 2 mg via INTRAVENOUS

## 2016-07-24 MED ORDER — DICYCLOMINE HCL 10 MG PO CAPS: 10.0000 mg | ORAL_CAPSULE | Freq: Two times a day (BID) | ORAL | 5 refills | Status: DC

## 2016-07-24 MED ORDER — MEPERIDINE HCL 50 MG/ML IJ SOLN
INTRAMUSCULAR | Status: AC
  Filled 2016-07-24: qty 1

## 2016-07-24 MED ORDER — PROMETHAZINE HCL 25 MG/ML IJ SOLN
25.0000 mg | Freq: Once | INTRAMUSCULAR | Status: AC
  Administered 2016-07-24: 25 mg via INTRAVENOUS

## 2016-07-24 MED ORDER — MIDAZOLAM HCL 5 MG/5ML IJ SOLN
INTRAMUSCULAR | Status: AC
  Filled 2016-07-24: qty 10

## 2016-07-24 MED ORDER — PROMETHAZINE HCL 25 MG/ML IJ SOLN
INTRAMUSCULAR | Status: AC
  Filled 2016-07-24: qty 1

## 2016-07-24 NOTE — Discharge Instructions (Signed)
Discontinue meloxicam but resume other medications as before. Dicyclomine 10 mg by mouth 30 minutes before breakfast and lunch daily. Resume usual diet. No driving for 24 hours. Physician will call with biopsy results.  Gastrointestinal Endoscopy, Care After Refer to this sheet in the next few weeks. These instructions provide you with information on caring for yourself after your procedure. Your caregiver may also give you more specific instructions. Your treatment has been planned according to current medical practices, but problems sometimes occur. Call your caregiver if you have any problems or questions after your procedure. HOME CARE INSTRUCTIONS  If you were given medicine to help you relax (sedative), do not drive, operate machinery, or sign important documents for 24 hours.  Avoid alcohol and hot or warm beverages for the first 24 hours after the procedure.  Only take over-the-counter or prescription medicines for pain, discomfort, or fever as directed by your caregiver. You may resume taking your normal medicines unless your caregiver tells you otherwise. Ask your caregiver when you may resume taking medicines that may cause bleeding, such as aspirin, clopidogrel, or warfarin.  You may return to your normal diet and activities on the day after your procedure, or as directed by your caregiver. Walking may help to reduce any bloated feeling in your abdomen.  Drink enough fluids to keep your urine clear or pale yellow.  You may gargle with salt water if you have a sore throat. SEEK IMMEDIATE MEDICAL CARE IF:  You have severe nausea or vomiting.  You have severe abdominal pain, abdominal cramps that last longer than 6 hours, or abdominal swelling (distention).  You have severe shoulder or back pain.  You have trouble swallowing.  You have shortness of breath, your breathing is shallow, or you are breathing faster than normal.  You have a fever or a rapid heartbeat.  You vomit  blood or material that looks like coffee grounds.  You have bloody, black, or tarry stools. MAKE SURE YOU:  Understand these instructions.  Will watch your condition.  Will get help right away if you are not doing well or get worse.   This information is not intended to replace advice given to you by your health care provider. Make sure you discuss any questions you have with your health care provider.   Document Released: 06/18/2004 Document Revised: 11/25/2014 Document Reviewed: 02/04/2012 Elsevier Interactive Patient Education [employer]2016 Elsevier Inc.

## 2016-07-24 NOTE — Op Note (Signed)
[facility] Patient Name: Thomas Hobbs Procedure Date: 07/24/2016 12:31 PM MRN: [id number] Date of Birth: 02-21-1976 Attending MD: Lionel December , MD CSN: [id number] Age: 40 Admit Type: Outpatient Procedure:                Upper GI endoscopy Indications:              Epigastric abdominal pain of over 6 months                            duration. Patient has chronic GERD and heartburn                            controlled with PPI. Providers:                Lionel December, MD, Nena Polio, RN, Burke Keels,                            Technician Referring MD:             Vilinda Blanks. Gerda Diss, MD Medicines:                Cetacaine spray, Promethazine 25 mg IV, Meperidine                            50 mg IV, Midazolam 8 mg IV Complications:            No immediate complications. Estimated Blood Loss:     Estimated blood loss was minimal. Procedure:                Pre-Anesthesia Assessment:                           - Prior to the procedure, a History and Physical                            was performed, and patient medications and                            allergies were reviewed. The patient's tolerance of                            previous anesthesia was also reviewed. The risks                            and benefits of the procedure and the sedation                            options and risks were discussed with the patient.                            All questions were answered, and informed consent                            was obtained. Prior Anticoagulants: The patient  last took previous NSAID medication 1 day prior to                            the procedure. ASA Grade Assessment: II - A patient                            with mild systemic disease. After reviewing the                            risks and benefits, the patient was deemed in                            satisfactory condition to undergo the procedure.  After obtaining informed consent, the endoscope was                            passed under direct vision. Throughout the                            procedure, the patient's blood pressure, pulse, and                            oxygen saturations were monitored continuously. The                            [id number] ([id number]) scope was introduced through the                            mouth, and advanced to the second part of duodenum.                            The upper GI endoscopy was accomplished without                            difficulty. The patient tolerated the procedure                            well. Scope In: 1:01:03 PM Scope Out: 1:09:52 PM Total Procedure Duration: 0 hours 8 minutes 49 seconds  Findings:      The examined esophagus was normal.      The Z-line was irregular and was found 35 cm from the incisors.      A 3 cm hiatal hernia was present.      Patchy minimal inflammation characterized by congestion (edema),       erythema and granularity was found in the gastric body. Biopsies were       taken with a cold forceps for histology.      The exam of the stomach was otherwise normal.      The duodenal bulb and second portion of the duodenum were normal. Impression:               - Normal esophagus.                           - Z-line irregular, 35 cm from the  incisors.                           - 3 cm hiatal hernia.                           - Gastritis. Biopsied.                           - Normal duodenal bulb and second portion of the                            duodenum.                           comment: focal gastritis at gastric body most                            likely secondary to Meloxicam which patient started                            1 week ago and not the source of his chronic pain. Moderate Sedation:      Moderate (conscious) sedation was administered by the endoscopy nurse       and supervised by the endoscopist. The following parameters were        monitored: oxygen saturation, heart rate, blood pressure, CO2       capnography and response to care. Total physician intraservice time was       14 minutes. Recommendation:           - Patient has a contact number available for                            emergencies. The signs and symptoms of potential                            delayed complications were discussed with the                            patient. Return to normal activities tomorrow.                            Written discharge instructions were provided to the                            patient.                           - Resume previous diet today.                           - Continue present medications.                           - Await pathology results.                           - Discontinue Meloxicam.                           -  Use Bentyl (dicyclomine) 10 mg PO BID 30 min AC. Procedure Code(s):        --- Professional ---                           [phone], Esophagogastroduodenoscopy, flexible,                            transoral; with biopsy, single or multiple                           99152, Moderate sedation services provided by the                            same physician or other qualified health care                            professional performing the diagnostic or                            therapeutic service that the sedation supports,                            requiring the presence of an independent trained                            observer to assist in the monitoring of the                            patient's level of consciousness and physiological                            status; initial 15 minutes of intraservice time,                            patient age 74 years or older Diagnosis Code(s):        --- Professional ---                           K22.8, Other specified diseases of esophagus                           K44.9, Diaphragmatic hernia without obstruction or                             gangrene                           K29.70, Gastritis, unspecified, without bleeding                           R10.13, Epigastric pain CPT copyright 2016 American Medical Association. All rights reserved. The codes documented in this report are preliminary and upon coder review may  be revised to meet current compliance requirements. Lionel December, MD Lionel December, MD 07/24/2016 1:29:33 PM This report has been signed electronically. Number of Addenda: 0

## 2016-07-24 NOTE — H&P (Addendum)
Thomas Hobbs is an 40 y.o. male.   Chief Complaint: Patient is here for EGD. HPI: Patient is 40 year old Caucasian male who presents with 6 month history of intermittent epigastric pain pain does not occur daily recurs times a week. They last for days when it occurs. Pain is not associated with nausea vomiting melena or rectal bleeding or diarrhea. He does notice loud noises. He has chronic GERD. He says heartburns well controlled with PPI. May take Tylenol and/or Advil no more than once twice a week for headache. He has been on meloxicam for 1 week. He denies anorexia or weight loss.  Past Medical History:  Diagnosis Date  . Depression   . GERD (gastroesophageal reflux disease)   . Hydrocele     Past Surgical History:  Procedure Laterality Date  . NO PAST SURGERIES      History reviewed. No pertinent family history. Social History:  reports that he has been smoking Cigarettes.  He has never used smokeless tobacco. He reports that he drinks alcohol. He reports that he does not use drugs.  Allergies:  Allergies  Allergen Reactions  . Augmentin [Amoxicillin-Pot Clavulanate] Nausea Only  . Cefzil [Cefprozil] Itching  . Levaquin [Levofloxacin In D5w]     Chest tightness, dizziness, lightheaded, myalgia  . Other Other (See Comments)    Patient states he is allergic to an antibiotic, unsure of name - possibly clindamycin (numbness and swelling to one side of face)    Medications Prior to Admission  Medication Sig Dispense Refill  . doxycycline (VIBRA-TABS) 100 MG tablet Take 100 mg by mouth 2 (two) times daily.    . meloxicam (MOBIC) 15 MG tablet Take 15 mg by mouth daily.    . pantoprazole (PROTONIX) 40 MG tablet TAKE 1 TABLET(40 MG) BY MOUTH DAILY 30 tablet 0  . sertraline (ZOLOFT) 100 MG tablet TAKE 1 TABLET BY MOUTH EVERY DAY 30 tablet 0    No results found for this or any previous visit (from the past 48 hour(s)). No results found.  ROS  Blood pressure 124/75, pulse 63,  temperature 98.6 F (37 C), temperature source Oral, resp. rate 15, height 5\' 4"  (1.626 m), weight 156 lb (70.8 kg), SpO2 99 %. Physical Exam  Constitutional: He appears well-developed and well-nourished.  HENT:  Mouth/Throat: Oropharynx is clear and moist.  Eyes: Conjunctivae are normal. No scleral icterus.  Neck: No thyromegaly present.  Cardiovascular: Normal rate, regular rhythm and normal heart sounds.   No murmur heard. Respiratory: Effort normal and breath sounds normal.  GI:  Abdomen is flat and soft with mild midepigastric tenderness. No organomegaly or masses.  Musculoskeletal: He exhibits no edema.  Lymphadenopathy:    He has no cervical adenopathy.  Neurological: He is alert.  Skin: Skin is warm and dry.     Assessment/Plan Epigastric pain. GERD symptoms controlled with PPI. Diagnostic EGD.  Thomas DecemberNajeeb Aleks Nawrot, MD 07/24/2016, 12:51 PM

## 2016-07-29 ENCOUNTER — Encounter (HOSPITAL_COMMUNITY): Payer: Self-pay | Admitting: Internal Medicine

## 2016-07-29 ENCOUNTER — Other Ambulatory Visit (INDEPENDENT_AMBULATORY_CARE_PROVIDER_SITE_OTHER): Payer: Self-pay | Admitting: Internal Medicine

## 2016-07-29 DIAGNOSIS — R1013 Epigastric pain: Principal | ICD-10-CM

## 2016-07-29 DIAGNOSIS — G8929 Other chronic pain: Secondary | ICD-10-CM

## 2016-07-29 MED ORDER — PANTOPRAZOLE SODIUM 40 MG PO TBEC: 40.0000 mg | DELAYED_RELEASE_TABLET | Freq: Two times a day (BID) | ORAL | 2 refills | Status: DC

## 2016-07-31 ENCOUNTER — Ambulatory Visit (HOSPITAL_COMMUNITY)
Admission: RE | Admit: 2016-07-31 | Discharge: 2016-07-31 | Disposition: A | Discharge status: Home / Self Care | Payer: 59 | Source: Ambulatory Visit | Attending: Internal Medicine | Admitting: Internal Medicine

## 2016-07-31 DIAGNOSIS — G8929 Other chronic pain: Secondary | ICD-10-CM | POA: Diagnosis not present

## 2016-07-31 DIAGNOSIS — R1013 Epigastric pain: Secondary | ICD-10-CM | POA: Insufficient documentation

## 2016-08-08 ENCOUNTER — Other Ambulatory Visit: Payer: Self-pay

## 2016-08-08 DIAGNOSIS — N433 Hydrocele, unspecified: Secondary | ICD-10-CM

## 2016-08-08 NOTE — Patient Instructions (Signed)
Your procedure is scheduled on: 08/14/2016  Report to [facility] at   10:30  AM.  Call this number if you have problems the morning of surgery: [phone]   Remember:   Do not drink or eat food:After Midnight.  :  Take these medicines the morning of surgery with A SIP OF WATER: Bentyl, Protonix and Zoloft   Do not wear jewelry, make-up or nail polish.  Do not wear lotions, powders, or perfumes. You may wear deodorant.  Do not shave 48 hours prior to surgery. Men may shave face and neck.  Do not bring valuables to the hospital.  Contacts, dentures or bridgework may not be worn into surgery.  Leave suitcase in the car. After surgery it may be brought to your room.  For patients admitted to the hospital, checkout time is 11:00 AM the day of discharge.   Patients discharged the day of surgery will not be allowed to drive home.    Special Instructions: Shower using CHG night before surgery and shower the day of surgery use CHG.  Use special wash - you have one bottle of CHG for all showers.  You should use approximately 1/2 of the bottle for each shower.  Hydrocelectomy, Care After Refer to this sheet in the next few weeks. These instructions provide you with information about caring for yourself after your procedure. Your health care provider may also give you more specific instructions. Your treatment has been planned according to current medical practices, but problems sometimes occur. Call your health care provider if you have any problems or questions after your procedure. WHAT TO EXPECT AFTER THE PROCEDURE After your procedure, it is common for the pouch that holds your testicles (scrotum) to be painful, swollen, and bruised. HOME CARE INSTRUCTIONS Bathing  Ask your health care provider when you can shower, take baths, or go swimming.  If you were told to wear an athletic support strap, take it off when you shower or take a bath. Incision Care  Follow instructions from your  health care provider about how to take care of your incision. Make sure you:  Wash your hands with soap and water before you change your bandage (dressing). If soap and water are not available, use hand sanitizer.  Change your dressing as told by your health care provider.  Leave stitches (sutures) in place.  Check your incision and scrotum every day for signs of infection. Check for:  More redness, swelling, or pain.  Blood or fluid.  Warmth.  Pus or a bad smell. Managing Pain, Stiffness, and Swelling  If directed, apply ice to the injured area:  Put ice in a plastic bag.  Place a towel between your skin and the bag.  Leave the ice on for 20 minutes, 2-3 times per day. Driving  Do not drive for 24 hours if you received a sedative.  Do not drive or operate heavy machinery while taking prescription pain medicine.  Ask your health care provider when it is safe to drive. Activity  Do not do any activities that require great strength and energy (are vigorous) for as long as told by your health care provider.  Return to your normal activities as told by your health care provider. Ask your health care provider what activities are safe for you.  Do not lift anything that is heavier than 10 lb (4.5 kg) until your health care provider says that it is safe. General Instructions  Take over-the-counter and prescription medicines only as told  by your health care provider.  Keep all follow-up visits as told by your health care provider. This is important.  If you were given an athletic support strap, wear it as told by your health care provider.  If you had a drain put in during the procedure, you will need to return to have it removed. SEEK MEDICAL CARE IF:  Your pain gets worse.  You have more redness, swelling, or pain around your scrotum.  You have blood or fluid coming from your scrotum.  Your incision feels warm to the touch.  You have pus or a bad smell coming from  your scrotum.  You have a fever.   This information is not intended to replace advice given to you by your health care provider. Make sure you discuss any questions you have with your health care provider.   Document Released: 07/26/2015 Document Reviewed: 05/03/2015 Elsevier Interactive Patient Education 2016 Elsevier Inc.  Hydrocele, Adult A hydrocele is a collection of fluid in the loose pouch of skin that holds the testicles (scrotum). Usually, it affects only one testicle. CAUSES This condition may be caused by:  An injury to the scrotum.  An infection.  A tumor or cancer of the testicle.  Twisting of a testicle.  Decreased blood flow to the scrotum. SYMPTOMS A hydrocele feels like a water-filled balloon. It may also feel heavy. A hydrocele can cause:  Swelling of the scrotum. The swelling may decrease when you lie down.  Swelling of the groin.  Mild discomfort in the scrotum.  Pain. This can develop if the hydrocele was caused by infection or twisting. DIAGNOSIS This condition may be diagnosed with a medical history, physical exam, and imaging tests. You may also have blood and urine tests to check for infection. TREATMENT Treatment may include:  Watching and waiting, particularly if the hydrocele causes no symptoms.  Treatment of the underlying condition. This may include using antibiotic medicine.  Surgery to drain the fluid. Some surgical options include:  Needle aspiration. For this procedure, a needle is used to drain fluid.  Hydrocelectomy. For this procedure, an incision is made in the scrotum to remove the fluid sac. HOME CARE INSTRUCTIONS  Keep all follow-up visits as told by your health care provider. This is important.  Watch the hydrocele for any changes.  Take over-the-counter and prescription medicines only as told by your health care provider.  If you were prescribed an antibiotic medicine, use it as told by your health care provider. Do  not stop using the antibiotic even if your condition improves. SEEK MEDICAL CARE IF:  The swelling in your scrotum or groin gets worse.  The hydrocele becomes red, firm, tender to the touch, or painful.  You notice any changes in the hydrocele.  You have a fever.   This information is not intended to replace advice given to you by your health care provider. Make sure you discuss any questions you have with your health care provider.   Document Released: 04/24/2010 Document Revised: 03/21/2015 Document Reviewed: 10/31/2014 Elsevier Interactive Patient Education 2016 Elsevier Inc.  Anesthesia, Adult, Care After Refer to this sheet in the next few weeks. These instructions provide you with information on caring for yourself after your procedure. Your health care provider may also give you more specific instructions. Your treatment has been planned according to current medical practices, but problems sometimes occur. Call your health care provider if you have any problems or questions after your procedure. WHAT TO EXPECT AFTER THE  PROCEDURE After the procedure, it is typical to experience:  Sleepiness.  Nausea and vomiting. HOME CARE INSTRUCTIONS  For the first 24 hours after general anesthesia:  Have a responsible person with you.  Do not drive a car. If you are alone, do not take public transportation.  Do not drink alcohol.  Do not take medicine that has not been prescribed by your health care provider.  Do not sign important papers or make important decisions.  You may resume a normal diet and activities as directed by your health care provider.  Change bandages (dressings) as directed.  If you have questions or problems that seem related to general anesthesia, call the hospital and ask for the anesthetist or anesthesiologist on call. SEEK MEDICAL CARE IF:  You have nausea and vomiting that continue the day after anesthesia.  You develop a rash. SEEK IMMEDIATE MEDICAL  CARE IF:   You have difficulty breathing.  You have chest pain.  You have any allergic problems.   This information is not intended to replace advice given to you by your health care provider. Make sure you discuss any questions you have with your health care provider.   Document Released: 02/10/2001 Document Revised: 11/25/2014 Document Reviewed: 03/04/2012 Elsevier Interactive Patient Education [employer].

## 2016-08-09 ENCOUNTER — Encounter (HOSPITAL_COMMUNITY): Payer: Self-pay

## 2016-08-09 ENCOUNTER — Encounter (HOSPITAL_COMMUNITY)
Admission: RE | Admit: 2016-08-09 | Discharge: 2016-08-09 | Disposition: A | Discharge status: Home / Self Care | Payer: 59 | Source: Ambulatory Visit | Attending: Urology | Admitting: Urology

## 2016-08-14 ENCOUNTER — Ambulatory Visit (HOSPITAL_COMMUNITY): Admission: RE | Admit: 2016-08-14 | Payer: 59 | Source: Ambulatory Visit | Admitting: Urology

## 2016-08-14 ENCOUNTER — Encounter (HOSPITAL_COMMUNITY): Admission: RE | Payer: Self-pay | Source: Ambulatory Visit

## 2016-08-14 SURGERY — HYDROCELECTOMY
Anesthesia: General | Laterality: Right

## 2016-08-15 ENCOUNTER — Other Ambulatory Visit: Payer: Self-pay | Admitting: Family Medicine

## 2016-09-15 ENCOUNTER — Other Ambulatory Visit: Payer: Self-pay | Admitting: Family Medicine

## 2016-10-14 ENCOUNTER — Other Ambulatory Visit: Payer: Self-pay | Admitting: Family Medicine

## 2016-10-15 NOTE — Telephone Encounter (Signed)
Ok plus 1 mo

## 2016-11-07 ENCOUNTER — Emergency Department (HOSPITAL_COMMUNITY)
Admission: EM | Admit: 2016-11-07 | Discharge: 2016-11-07 | Disposition: A | Discharge status: Home / Self Care | Payer: 59 | Attending: Dermatology | Admitting: Dermatology

## 2016-11-07 ENCOUNTER — Encounter (HOSPITAL_COMMUNITY): Payer: Self-pay | Admitting: Emergency Medicine

## 2016-11-07 DIAGNOSIS — Z5321 Procedure and treatment not carried out due to patient leaving prior to being seen by health care provider: Secondary | ICD-10-CM | POA: Insufficient documentation

## 2016-11-07 DIAGNOSIS — F17210 Nicotine dependence, cigarettes, uncomplicated: Secondary | ICD-10-CM | POA: Insufficient documentation

## 2016-11-07 DIAGNOSIS — R109 Unspecified abdominal pain: Secondary | ICD-10-CM | POA: Insufficient documentation

## 2016-11-07 LAB — CBC
HCT: 45.4 % (ref 39.0–52.0)
Hemoglobin: 15.9 g/dL (ref 13.0–17.0)
MCH: 31.1 pg (ref 26.0–34.0)
MCHC: 35 g/dL (ref 30.0–36.0)
MCV: 88.7 fL (ref 78.0–100.0)
PLATELETS: 253 10*3/uL (ref 150–400)
RBC: 5.12 MIL/uL (ref 4.22–5.81)
RDW: 13.1 % (ref 11.5–15.5)
WBC: 11.1 10*3/uL — ABNORMAL HIGH (ref 4.0–10.5)

## 2016-11-07 LAB — COMPREHENSIVE METABOLIC PANEL
ALBUMIN: 4.2 g/dL (ref 3.5–5.0)
ALK PHOS: 86 U/L (ref 38–126)
ALT: 9 U/L — AB (ref 17–63)
AST: 17 U/L (ref 15–41)
Anion gap: 8 (ref 5–15)
BUN: 12 mg/dL (ref 6–20)
CHLORIDE: 101 mmol/L (ref 101–111)
CO2: 25 mmol/L (ref 22–32)
CREATININE: 0.81 mg/dL (ref 0.61–1.24)
Calcium: 9.2 mg/dL (ref 8.9–10.3)
GFR calc Af Amer: >60 mL/min (ref >60)
GFR calc non Af Amer: >60 mL/min (ref >60)
GLUCOSE: 109 mg/dL — AB (ref 65–99)
Potassium: 3.8 mmol/L (ref 3.5–5.1)
SODIUM: 134 mmol/L — AB (ref 135–145)
Total Bilirubin: 0.4 mg/dL (ref 0.3–1.2)
Total Protein: 7.4 g/dL (ref 6.5–8.1)

## 2016-11-07 LAB — LIPASE, BLOOD: LIPASE: 32 U/L (ref 11–51)

## 2016-11-07 NOTE — ED Triage Notes (Signed)
Pt reports fever, R sided abdominal pain and emesis that began on Tuesday. Pt states he vomited 1 time last night.

## 2016-11-07 NOTE — ED Notes (Signed)
No answer in waiting area.

## 2016-11-08 ENCOUNTER — Ambulatory Visit (HOSPITAL_COMMUNITY)
Admission: EM | Admit: 2016-11-08 | Discharge: 2016-11-08 | Disposition: A | Discharge status: Home / Self Care | Payer: 59 | Attending: Family Medicine | Admitting: Family Medicine

## 2016-11-08 ENCOUNTER — Encounter (HOSPITAL_COMMUNITY): Payer: Self-pay | Admitting: Family Medicine

## 2016-11-08 DIAGNOSIS — R1084 Generalized abdominal pain: Secondary | ICD-10-CM

## 2016-11-08 MED ORDER — ONDANSETRON 4 MG PO TBDP
4.0000 mg | ORAL_TABLET | Freq: Once | ORAL | Status: AC
  Administered 2016-11-08: 4 mg via ORAL

## 2016-11-08 MED ORDER — ONDANSETRON HCL 4 MG PO TABS: 4.0000 mg | ORAL_TABLET | Freq: Four times a day (QID) | ORAL | 0 refills | Status: DC

## 2016-11-08 MED ORDER — ONDANSETRON 4 MG PO TBDP
ORAL_TABLET | ORAL | Status: AC
  Filled 2016-11-08: qty 1

## 2016-11-08 NOTE — Discharge Instructions (Signed)
Clear liquid , bland diet today tolerated, advance on sat as improved, use medicine as needed, return or see your doctor if any problems.

## 2016-11-08 NOTE — ED Triage Notes (Signed)
Pt here for RUQ pain that started Wednesday morning. sts vomited once Wednesday and fever.

## 2016-11-08 NOTE — ED Provider Notes (Signed)
[facility]    CSN: [id number] Arrival date & time: 11/08/16  1003     History   Chief Complaint Chief Complaint  Patient presents with  . Abdominal Pain    HPI Thomas Hobbs is a 40 y.o. male.   The history is provided by the patient.  Abdominal Pain  Pain location:  Generalized Pain quality: burning   Pain radiates to:  Does not radiate Pain severity:  Mild Onset quality:  Sudden Duration:  2 days Chronicity:  New Context comment:  Went to [facility] yest had lab but lwbs, labs wnl. had fever on wed, none today, sx improving., no diarrhea. Relieved by:  None tried Associated symptoms: fever, nausea and vomiting   Associated symptoms: no diarrhea     Past Medical History:  Diagnosis Date  . Depression   . GERD (gastroesophageal reflux disease)   . Hydrocele     Patient Active Problem List   Diagnosis Date Noted  . Esophageal reflux 11/05/2014  . Essential tremor 03/31/2014  . Depression with anxiety 02/13/2013  . Contact dermatitis 02/13/2013    Past Surgical History:  Procedure Laterality Date  . BIOPSY  07/24/2016   Procedure: BIOPSY;  Surgeon: Malissa HippoNajeeb U Rehman, MD;  Location: [facility];  Service: Endoscopy;;  Gastric biopsy  . ESOPHAGOGASTRODUODENOSCOPY N/A 07/24/2016   Procedure: ESOPHAGOGASTRODUODENOSCOPY (EGD);  Surgeon: Malissa HippoNajeeb U Rehman, MD;  Location: [facility];  Service: Endoscopy;  Laterality: N/A;  12:00  . NO PAST SURGERIES         Home Medications    Prior to Admission medications   Medication Sig Start Date End Date Taking? Authorizing Provider  dicyclomine (BENTYL) 10 MG capsule Take 1 capsule (10 mg total) by mouth 2 (two) times daily before a meal. 07/24/16   Malissa HippoNajeeb U Rehman, MD  doxycycline (VIBRA-TABS) 100 MG tablet Take 100 mg by mouth 2 (two) times daily.    Historical Provider, MD  pantoprazole (PROTONIX) 40 MG tablet Take 1 tablet (40 mg total) by mouth 2 (two) times daily before a meal. 07/29/16   Malissa HippoNajeeb U  Rehman, MD  sertraline (ZOLOFT) 100 MG tablet TAKE 1 TABLET BY MOUTH EVERY DAY 10/15/16   Merlyn AlbertWilliam S Luking, MD    Family History History reviewed. No pertinent family history.  Social History Social History  Substance Use Topics  . Smoking status: Current Every Day Smoker    Packs/day: 1.50    Types: Cigarettes  . Smokeless tobacco: Never Used  . Alcohol use No     Comment: occ     Allergies   Augmentin [amoxicillin-pot clavulanate]; Cefzil [cefprozil]; Levaquin [levofloxacin in d5w]; and Other   Review of Systems Review of Systems  Constitutional: Positive for appetite change and fever. Negative for activity change.  HENT: Negative.   Respiratory: Negative.   Gastrointestinal: Positive for abdominal pain, nausea and vomiting. Negative for abdominal distention, anal bleeding, blood in stool and diarrhea.  Genitourinary: Negative.      Physical Exam Triage Vital Signs ED Triage Vitals  Enc Vitals Group     BP 11/08/16 1028 118/79     Pulse Rate 11/08/16 1028 71     Resp 11/08/16 1028 18     Temp 11/08/16 1028 99 F (37.2 C)     Temp Source 11/08/16 1028 Oral     SpO2 11/08/16 1028 98 %     Weight --      Height --      Head  Circumference --      Peak Flow --      Pain Score 11/08/16 1039 4     Pain Loc --      Pain Edu? --      Excl. in GC? --    No data found.   Updated Vital Signs BP 118/79 (BP Location: Left Arm)   Pulse 71   Temp 99 F (37.2 C) (Oral)   Resp 18   SpO2 98%   Visual Acuity Right Eye Distance:   Left Eye Distance:   Bilateral Distance:    Right Eye Near:   Left Eye Near:    Bilateral Near:     Physical Exam  Constitutional: He is oriented to person, place, and time. He appears well-developed and well-nourished. No distress.  HENT:  Mouth/Throat: Oropharynx is clear and moist.  Pulmonary/Chest: Effort normal.  Abdominal: Soft. He exhibits no mass. Bowel sounds are increased. There is generalized tenderness. There is no  rigidity, no rebound, no guarding and no CVA tenderness. No hernia.  Neurological: He is alert and oriented to person, place, and time.  Nursing note and vitals reviewed.    UC Treatments / Results  Labs (all labs ordered are listed, but only abnormal results are displayed) Labs Reviewed - No data to display  EKG  EKG Interpretation None       Radiology No results found.  Procedures Procedures (including critical care time)  Medications Ordered in UC Medications  ondansetron (ZOFRAN-ODT) disintegrating tablet 4 mg (not administered)     Initial Impression / Assessment and Plan / UC Course  I have reviewed the triage vital signs and the nursing notes.  Pertinent labs & imaging results that were available during my care of the patient were reviewed by me and considered in my medical decision making (see chart for details).  Clinical Course       Final Clinical Impressions(s) / UC Diagnoses   Final diagnoses:  None    New Prescriptions New Prescriptions   No medications on file     Linna HoffJames D Shanira Tine, MD 11/08/16 1109

## 2016-11-12 ENCOUNTER — Encounter: Payer: Self-pay | Admitting: Family Medicine

## 2016-11-12 ENCOUNTER — Ambulatory Visit (INDEPENDENT_AMBULATORY_CARE_PROVIDER_SITE_OTHER): Payer: 59 | Admitting: Family Medicine

## 2016-11-12 VITALS — BP 120/80 | Ht 64.75 in | Wt 153.5 lb

## 2016-11-12 DIAGNOSIS — Z23 Encounter for immunization: Secondary | ICD-10-CM

## 2016-11-12 DIAGNOSIS — Z0000 Encounter for general adult medical examination without abnormal findings: Secondary | ICD-10-CM | POA: Diagnosis not present

## 2016-11-12 MED ORDER — SERTRALINE HCL 100 MG PO TABS: 100.0000 mg | ORAL_TABLET | Freq: Every day | ORAL | 5 refills | Status: DC

## 2016-11-12 NOTE — Progress Notes (Signed)
   Subjective:    Patient ID: Thomas Hobbs, male    DOB: 03/18/1976, 40 y.o.   MRN: [id number]  HPI The patient comes in today for a wellness visit.    A review of their health history was completed.  A review of medications was also completed.  Any needed refills: zoloft. Pt states still helping    Eating habits: not good  Falls/  MVA accidents in past few months: none  Regular exercise: none, not reg, but walks a lot, staying active     Specialist pt sees on regular basis: none  Preventative health issues were discussed.   Additional concerns: none   Review of Systems  Constitutional: Negative for activity change, appetite change and fever.  HENT: Negative for congestion and rhinorrhea.   Eyes: Negative for discharge.  Respiratory: Negative for cough and wheezing.   Cardiovascular: Negative for chest pain.  Gastrointestinal: Negative for abdominal pain, blood in stool and vomiting.  Genitourinary: Negative for difficulty urinating and frequency.  Musculoskeletal: Negative for neck pain.  Skin: Negative for rash.  Allergic/Immunologic: Negative for environmental allergies and food allergies.  Neurological: Negative for weakness and headaches.  Psychiatric/Behavioral: Negative for agitation.  All other systems reviewed and are negative.      Objective:   Physical Exam  Constitutional: He appears well-developed and well-nourished.  HENT:  Head: Normocephalic and atraumatic.  Right Ear: External ear normal.  Left Ear: External ear normal.  Nose: Nose normal.  Mouth/Throat: Oropharynx is clear and moist.  Eyes: EOM are normal. Pupils are equal, round, and reactive to light.  Neck: Normal range of motion. Neck supple. No thyromegaly present.  Cardiovascular: Normal rate, regular rhythm and normal heart sounds.   No murmur heard. Pulmonary/Chest: Effort normal and breath sounds normal. No respiratory distress. He has no wheezes.  Abdominal: Soft. Bowel  sounds are normal. He exhibits no distension and no mass. There is no tenderness.  Genitourinary: Penis normal.  Musculoskeletal: Normal range of motion. He exhibits no edema.  Lymphadenopathy:    He has no cervical adenopathy.  Neurological: He is alert. He exhibits normal muscle tone.  Skin: Skin is warm and dry. No erythema.  Psychiatric: He has a normal mood and affect. His behavior is normal. Judgment normal.  Vitals reviewed.         Assessment & Plan:  Impression wellness exam #2 depression discussed needs refill on Zoloft. States overall helping him. Would not like to try to get off at this time. #3 history of endoscopy. Recently. Was wondering what diagnosis was. Reviewed it was gastritis with a hiatal hernia plan diet exercise discussed. Encouraged to cut down on fat intake. Flu shot

## 2016-11-12 NOTE — Patient Instructions (Signed)

## 2016-11-20 ENCOUNTER — Other Ambulatory Visit: Payer: Self-pay | Admitting: Family Medicine

## 2016-12-13 DIAGNOSIS — N433 Hydrocele, unspecified: Secondary | ICD-10-CM | POA: Diagnosis not present

## 2017-01-08 ENCOUNTER — Ambulatory Visit (INDEPENDENT_AMBULATORY_CARE_PROVIDER_SITE_OTHER): Payer: 59 | Admitting: Family Medicine

## 2017-01-08 ENCOUNTER — Encounter: Payer: Self-pay | Admitting: Family Medicine

## 2017-01-08 VITALS — BP 120/86 | Temp 98.1°F | Ht 64.75 in | Wt 154.1 lb

## 2017-01-08 DIAGNOSIS — Z113 Encounter for screening for infections with a predominantly sexual mode of transmission: Secondary | ICD-10-CM | POA: Diagnosis not present

## 2017-01-08 DIAGNOSIS — N433 Hydrocele, unspecified: Secondary | ICD-10-CM | POA: Diagnosis not present

## 2017-01-08 NOTE — Progress Notes (Signed)
   Subjective:    Patient ID: Thomas Hobbs, male    DOB: 01/31/1976, 41 y.o.   MRN: [id number]  HPI Patient is here today to have some routine std testing done. Patient received an anynomous tip recently that a partner tested positive for an STD.  Patient states that he has no other concerns at this time.   No symptoms not sexualyy  Patient received a text alert on his phone that he may have been exposed to an STD. No recent sexual activity. Ration is worried about though. He has had unprotected sex in the past several years.  No dysuria no rash no skin sores     Review of Systems See above    Objective:   Physical Exam  Alert vitals stable, NAD. Blood pressure good on repeat. HEENT normal. Lungs clear. Heart regular rate and rhythm. Urethral sample obtained with appropriate technique  Hydrocele improved    Assessment & Plan:  Impression STD screening and discussion safe sex discussed plan further recommendations based on blood work easily 15 minutes spent most in discussion status post tapping of hydrocele by urologist discussed with patient

## 2017-01-09 LAB — HIV ANTIBODY (ROUTINE TESTING W REFLEX): HIV Screen 4th Generation wRfx: NONREACTIVE

## 2017-01-09 LAB — RPR: RPR Ser Ql: NONREACTIVE

## 2017-01-10 LAB — GC/CHLAMYDIA PROBE AMP
CHLAMYDIA, DNA PROBE: NEGATIVE
Neisseria gonorrhoeae by PCR: NEGATIVE

## 2017-02-01 ENCOUNTER — Other Ambulatory Visit (INDEPENDENT_AMBULATORY_CARE_PROVIDER_SITE_OTHER): Payer: Self-pay | Admitting: Internal Medicine

## 2017-02-18 ENCOUNTER — Other Ambulatory Visit: Payer: Self-pay | Admitting: Family Medicine

## 2017-09-07 ENCOUNTER — Other Ambulatory Visit: Payer: Self-pay | Admitting: Family Medicine

## 2017-09-09 NOTE — Telephone Encounter (Signed)
Ok plus one ref, write needs appt on rx

## 2017-09-15 ENCOUNTER — Encounter: Payer: Self-pay | Admitting: Family Medicine

## 2017-09-15 ENCOUNTER — Ambulatory Visit (INDEPENDENT_AMBULATORY_CARE_PROVIDER_SITE_OTHER): Payer: 59 | Admitting: Family Medicine

## 2017-09-15 VITALS — BP 118/72 | Temp 98.0°F | Ht 64.75 in | Wt 146.0 lb

## 2017-09-15 DIAGNOSIS — J019 Acute sinusitis, unspecified: Secondary | ICD-10-CM | POA: Diagnosis not present

## 2017-09-15 MED ORDER — DOXYCYCLINE HYCLATE 100 MG PO CAPS: 100.0000 mg | ORAL_CAPSULE | Freq: Two times a day (BID) | ORAL | 0 refills | Status: DC

## 2017-09-15 NOTE — Progress Notes (Signed)
   Subjective:    Patient ID: Thomas Hobbs, male    DOB: 03/06/1976, 41 y.o.   MRN: [id number]  Sinus Problem  This is a new problem. The current episode started 1 to 4 weeks ago. Associated symptoms include congestion, coughing, ear pain, headaches and a sore throat. Past treatments include acetaminophen.  Viral-like illness for several days head congestion drainage coughing sinus pressure pain discomfort he went on a trip to [place] and came back with a cold and now having significant sinus pain  Patient has history of removal of the tooth that caused a hole into his sinus making him more prone to sinus infections   Review of Systems  Constitutional: Negative for activity change and fever.  HENT: Positive for congestion, ear pain, rhinorrhea and sore throat.   Eyes: Negative for discharge.  Respiratory: Positive for cough. Negative for wheezing.   Cardiovascular: Negative for chest pain.  Neurological: Positive for headaches.       Objective:   Physical Exam  Constitutional: He appears well-developed.  HENT:  Head: Normocephalic.  Mouth/Throat: Oropharynx is clear and moist. No oropharyngeal exudate.  Neck: Normal range of motion.  Cardiovascular: Normal rate, regular rhythm and normal heart sounds.   No murmur heard. Pulmonary/Chest: Effort normal and breath sounds normal. He has no wheezes.  Lymphadenopathy:    He has no cervical adenopathy.  Neurological: He exhibits normal muscle tone.  Skin: Skin is warm and dry.  Nursing note and vitals reviewed.         Assessment & Plan:  Viral syndrome Secondary rhinosinusitis Antibiotics prescribed warning signs discussed Follow-up of ongoing troubles

## 2017-11-17 ENCOUNTER — Ambulatory Visit (INDEPENDENT_AMBULATORY_CARE_PROVIDER_SITE_OTHER): Payer: 59 | Admitting: Family Medicine

## 2017-11-17 ENCOUNTER — Encounter: Payer: 59 | Admitting: Family Medicine

## 2017-11-17 ENCOUNTER — Encounter: Payer: Self-pay | Admitting: Family Medicine

## 2017-11-17 VITALS — BP 128/80 | Ht 64.5 in | Wt 141.0 lb

## 2017-11-17 DIAGNOSIS — G250 Essential tremor: Secondary | ICD-10-CM | POA: Diagnosis not present

## 2017-11-17 DIAGNOSIS — Z0001 Encounter for general adult medical examination with abnormal findings: Secondary | ICD-10-CM

## 2017-11-17 DIAGNOSIS — K2100 Gastro-esophageal reflux disease with esophagitis, without bleeding: Secondary | ICD-10-CM

## 2017-11-17 DIAGNOSIS — F418 Other specified anxiety disorders: Secondary | ICD-10-CM | POA: Diagnosis not present

## 2017-11-17 DIAGNOSIS — F411 Generalized anxiety disorder: Secondary | ICD-10-CM | POA: Diagnosis not present

## 2017-11-17 DIAGNOSIS — Z0000 Encounter for general adult medical examination without abnormal findings: Secondary | ICD-10-CM

## 2017-11-17 MED ORDER — BUPROPION HCL ER (SR) 150 MG PO TB12: ORAL_TABLET | ORAL | 4 refills | Status: DC

## 2017-11-17 MED ORDER — BUPROPION HCL ER (SR) 150 MG PO TB12: ORAL_TABLET | ORAL | 0 refills | Status: DC

## 2017-11-17 NOTE — Progress Notes (Signed)
Subjective:    Patient ID: Thomas Hobbs, male    DOB: 07/12/1976, 41 y.o.   MRN: [id number]  HPI The patient comes in today for a wellness visit.    A review of their health history was completed.  A review of medications was also completed.  Any needed refills; zoloft but wants to come off of med. He is currently taking one half tablet to try and come off of med.  Eating habits: not health conscious  Falls/  MVA accidents in past few months: none  Regular exercise: none  Specialist pt sees on regular basis: none  Preventative health issues were discussed.   Additional concerns: coming off of zoloft  Stays ctive exe5cise st work, not ideal er pt   Twenty years has been on zoloft , does not fel like needed to take to start in the first place. Has extreme anxieties. States not depressed. Having a bit o f ED challenges and concerned it may be causeing it.  Strong family history of anxiety.  Patient wishes to get off of Zoloft.  Feels it is working as his erectile dysfunction.  Admits he has issues of anxiety..  Claims no depression no suicidal or homicidal thoughts  Tremor still substantial and not good  No hx of colon or prost ca     Review of Systems  Constitutional: Negative for activity change, appetite change and fever.  HENT: Negative for congestion and rhinorrhea.   Eyes: Negative for discharge.  Respiratory: Negative for cough and wheezing.   Cardiovascular: Negative for chest pain.  Gastrointestinal: Negative for abdominal pain, blood in stool and vomiting.  Genitourinary: Negative for difficulty urinating and frequency.  Musculoskeletal: Negative for neck pain.  Skin: Negative for rash.  Allergic/Immunologic: Negative for environmental allergies and food allergies.  Neurological: Negative for weakness and headaches.  Psychiatric/Behavioral: Negative for agitation.  All other systems reviewed and are negative.      Objective:   Physical Exam    Constitutional: He appears well-developed and well-nourished.  HENT:  Head: Normocephalic and atraumatic.  Right Ear: External ear normal.  Left Ear: External ear normal.  Nose: Nose normal.  Mouth/Throat: Oropharynx is clear and moist.  Eyes: EOM are normal. Pupils are equal, round, and reactive to light.  Neck: Normal range of motion. Neck supple. No thyromegaly present.  Cardiovascular: Normal rate, regular rhythm and normal heart sounds.  No murmur heard. Pulmonary/Chest: Effort normal and breath sounds normal. No respiratory distress. He has no wheezes.  Abdominal: Soft. Bowel sounds are normal. He exhibits no distension and no mass. There is no tenderness.  Genitourinary: Penis normal.  Musculoskeletal: Normal range of motion. He exhibits no edema.  Lymphadenopathy:    He has no cervical adenopathy.  Neurological: He is alert. He exhibits normal muscle tone.  Skin: Skin is warm and dry. No erythema.  Psychiatric: He has a normal mood and affect. His behavior is normal. Judgment normal.  Vitals reviewed.         Assessment & Plan:  Impression  wellness 1 wellness exam diet discussed.  Exercise discussed.  Blood work discussed.  2.  Generalized anxiety disorder.  Complicated by erectile dysfunction with current medication.  Patient also inclined to get this medication.  Also frustrated about ongoing smoking.  Long discussion held.  Will wean off Zoloft.  Has already cut down to half a tablet.  Will initiate Wellbutrin SR side effects benefits discussed to use  3Indication essential tremor.  Not particularly  evident on today's exam.  4.  Sebaceous cyst.  Patient reassured does not need excision  5pt has hydrocele, actually drains on his own , declines uro ref  meds rxed, apprpr b w, further rec on results

## 2017-11-17 NOTE — Patient Instructions (Addendum)
One fourth of a tablet of zoloft ea morn for five d  At same time, start wellbutrin  One ea morn for three days, then one bid  Smoking Tobacco Information Smoking tobacco will very likely harm your health. Tobacco contains a poisonous (toxic), colorless chemical called nicotine. Nicotine affects the brain and makes tobacco addictive. This change in your brain can make it hard to stop smoking. Tobacco also has other toxic chemicals that can hurt your body and raise your risk of many cancers. How can smoking tobacco affect me? Smoking tobacco can increase your chances of having serious health conditions, such as:  Cancer. Smoking is most commonly associated with lung cancer, but can lead to cancer in other parts of the body.  Chronic obstructive pulmonary disease (COPD). This is a long-term lung condition that makes it hard to breathe. It also gets worse over time.  High blood pressure (hypertension), heart disease, stroke, or heart attack.  Lung infections, such as pneumonia.  Cataracts. This is when the lenses in the eyes become clouded.  Digestive problems. This may include peptic ulcers, heartburn, and gastroesophageal reflux disease (GERD).  Oral health problems, such as gum disease and tooth loss.  Loss of taste and smell.  Smoking can affect your appearance by causing:  Wrinkles.  Yellow or stained teeth, fingers, and fingernails.  Smoking tobacco can also affect your social life.  Many workplaces, [employer], hotels, and public places are tobacco-free. This means that you may experience challenges in finding places to smoke when away from home.  The cost of a smoking habit can be expensive. Expenses for someone who smokes come in two ways: ? You spend money on a regular basis to buy tobacco. ? Your health care costs in the long-term are higher if you smoke.  Tobacco smoke can also affect the health of those around you. Children of smokers have greater chances  of: ? Sudden infant death syndrome (SIDS). ? Ear infections. ? Lung infections.  What lifestyle changes can be made?  Do not start smoking. Quit if you already do.  To quit smoking: ? Make a plan to quit smoking and commit yourself to it. Look for programs to help you and ask your health care provider for recommendations and ideas. ? Talk with your health care provider about using nicotine replacement medicines to help you quit. Medicine replacement medicines include gum, lozenges, patches, sprays, or pills. ? Do not replace cigarette smoking with electronic cigarettes, which are commonly called e-cigarettes. The safety of e-cigarettes is not known, and some may contain harmful chemicals. ? Avoid places, people, or situations that tempt you to smoke. ? If you try to quit but return to smoking, don't give up hope. It is very common for people to try a number of times before they fully succeed. When you feel ready again, give it another try.  Quitting smoking might affect the way you eat as well as your weight. Be prepared to monitor your eating habits. Get support in planning and following a healthy diet.  Ask your health care provider about having regular tests (screenings) to check for cancer. This may include blood tests, imaging tests, and other tests.  Exercise regularly. Consider taking walks, joining a gym, or doing yoga or exercise classes.  Develop skills to manage your stress. These skills include meditation. What are the benefits of quitting smoking? By quitting smoking, you may:  Lower your risk of getting cancer and other diseases caused by smoking.  Live longer.  Breathe better.  Lower your blood pressure and heart rate.  Stop your addiction to tobacco.  Stop creating secondhand smoke that hurts other people.  Improve your sense of taste and smell.  Look better over time, due to having fewer wrinkles and less staining.  What can happen if changes are not  made? If you do not stop smoking, you may:  Get cancer and other diseases.  Develop COPD or other long-term (chronic) lung conditions.  Develop serious problems with your heart and blood vessels (cardiovascular system).  Need more tests to screen for problems caused by smoking.  Have higher, long-term healthcare costs from medicines or treatments related to smoking.  Continue to have worsening changes in your lungs, mouth, and nose.  Where to find support: To get support to quit smoking, consider:  Asking your health care provider for more information and resources.  Taking classes to learn more about quitting smoking.  Looking for local organizations that offer resources about quitting smoking.  Joining a support group for people who want to quit smoking in your local community.  Where to find more information: You may find more information about quitting smoking from:  HelpGuide.org: www.helpguide.org/articles/addictions/how-to-quit-smoking.htm  BankRights[employer]uySmokefree.gov: smokefree.gov  American Lung Association: www.lung.org  Contact a health care provider if:  You have problems breathing.  Your lips, nose, or fingers turn blue.  You have chest pain.  You are coughing up blood.  You feel faint or you pass out.  You have other noticeable changes that cause you to worry. Summary  Smoking tobacco can negatively affect your health, the health of those around you, your finances, and your social life.  Do not start smoking. Quit if you already do. If you need help quitting, ask your health care provider.  Think about joining a support group for people who want to quit smoking in your local community. There are many effective programs that will help you to quit this behavior. This information is not intended to replace advice given to you by your health care provider. Make sure you discuss any questions you have with your health care provider. Document Released: 11/19/2016  Document Revised: 11/19/2016 Document Reviewed: 11/19/2016 Elsevier Interactive Patient Education  [employer]2018 Elsevier Inc.

## 2017-11-23 DIAGNOSIS — Z5321 Procedure and treatment not carried out due to patient leaving prior to being seen by health care provider: Secondary | ICD-10-CM | POA: Diagnosis not present

## 2017-11-24 ENCOUNTER — Other Ambulatory Visit: Payer: Self-pay | Admitting: *Deleted

## 2017-11-24 ENCOUNTER — Telehealth: Payer: Self-pay | Admitting: Family Medicine

## 2017-11-24 MED ORDER — SERTRALINE HCL 100 MG PO TABS: ORAL_TABLET | ORAL | 0 refills | Status: DC

## 2017-11-24 NOTE — Telephone Encounter (Signed)
Go back on zoloft dose as he was taking ( ibelieve was on one half a zoloft tab ea day), hold off on wellbutrin

## 2017-11-24 NOTE — Telephone Encounter (Signed)
Pt changed from zoloft to wellbutrin sr 17022m bid on 12/31 due to pt wanting to stop zoloft. Pt states he took a dose yesterday morning and none last night or this morning. He is better today but still having right hand numbness, dizziness. He is willing to go back on zoloft or change to something different depends on what dr Brett Canalessteve thinks is best.   Call pt back for pharm he is in [place] now but usually uses pharm in [place] so it depends on where he is when we call.

## 2017-11-24 NOTE — Telephone Encounter (Signed)
Discussed with pt. Pt verbalized understanding. He was taking 100mg  one half tablet daily. Med sent to [facility] in [place].

## 2017-11-24 NOTE — Telephone Encounter (Signed)
Pt called stating that he was recently put on wellbutrin and that he is experiencing side effects and panic attacks and numbness in his hands.  Pt is wanting to know what he should do.

## 2017-11-26 ENCOUNTER — Ambulatory Visit: Payer: 59 | Admitting: Family Medicine

## 2017-11-26 ENCOUNTER — Encounter: Payer: Self-pay | Admitting: Family Medicine

## 2017-11-26 VITALS — BP 118/72 | Ht 64.5 in | Wt 142.6 lb

## 2017-11-26 DIAGNOSIS — K2100 Gastro-esophageal reflux disease with esophagitis, without bleeding: Secondary | ICD-10-CM

## 2017-11-26 DIAGNOSIS — G250 Essential tremor: Secondary | ICD-10-CM

## 2017-11-26 MED ORDER — SERTRALINE HCL 50 MG PO TABS: ORAL_TABLET | ORAL | 3 refills | Status: DC

## 2017-11-26 MED ORDER — SILDENAFIL CITRATE 20 MG PO TABS: ORAL_TABLET | ORAL | 1 refills | Status: DC

## 2017-11-26 NOTE — Progress Notes (Signed)
   Subjective:    Patient ID: Thomas Hobbs, male    DOB: 03/05/1976, 42 y.o.   MRN: [id number]  HPI Patient arrives with c/o anxiety and possible ED. Patient feels like the anxiety may be leading to the ED.   Had difficult time trying to wen off of the zoloft  Had numbness and tingling in hand s and feet  By mon eve was starting to feel better   at this point has held off on resuming Zoloft.  Would like to try on his own.  No suicidal thoughts no homicidal thoughts     Review of Systems No headache, no major weight loss or weight gain, no chest pain no back pain abdominal pain no change in bowel habits complete ROS otherwise negative     Objective:   Physical Exam   Alert vitals stable, NAD. Blood pressure good on repeat. HEENT normal. Lungs clear. Heart regular rate and rhythm.      Assessment & Plan:  Impression: Anxiety/depression.  Chronic.  Patient had some early serotonin withdrawal symptoms after immediately stopping.  That is now better.  Like to trial of Zoloft.  Discussed.  Have represcribed just in case he plans to reinitiate at lower dose

## 2017-12-03 ENCOUNTER — Telehealth: Payer: Self-pay | Admitting: Family Medicine

## 2017-12-03 NOTE — Telephone Encounter (Signed)
Patient said that [facility] [place] did not get Rx for Sildenafil that was called in the other day.  Please advise.

## 2017-12-03 NOTE — Telephone Encounter (Signed)
Pharmacy stated they did receive the prescription but his insurance does not cover medication and cash price is $439  So they did not fill and put it on file

## 2017-12-03 NOTE — Telephone Encounter (Signed)
Patient states he can not afford the sildenafil and wonders if there are any OTC natural supplements or medications you recommend that may help

## 2017-12-03 NOTE — Telephone Encounter (Signed)
Discuss with pt the [facility] option, no otc supplements help

## 2017-12-03 NOTE — Telephone Encounter (Signed)
Patient advised per Dr Brett CanalesSteve that no OTC supplements really help and patient was given information to contact [facility] concerning his prescription. Patient verbalized understanding.

## 2017-12-18 ENCOUNTER — Other Ambulatory Visit: Payer: Self-pay

## 2017-12-18 ENCOUNTER — Emergency Department (HOSPITAL_COMMUNITY): Payer: 59

## 2017-12-18 ENCOUNTER — Encounter (HOSPITAL_COMMUNITY): Payer: Self-pay | Admitting: Emergency Medicine

## 2017-12-18 DIAGNOSIS — R079 Chest pain, unspecified: Secondary | ICD-10-CM | POA: Diagnosis present

## 2017-12-18 DIAGNOSIS — Z79899 Other long term (current) drug therapy: Secondary | ICD-10-CM | POA: Diagnosis not present

## 2017-12-18 DIAGNOSIS — R0789 Other chest pain: Secondary | ICD-10-CM | POA: Diagnosis not present

## 2017-12-18 DIAGNOSIS — F17210 Nicotine dependence, cigarettes, uncomplicated: Secondary | ICD-10-CM | POA: Insufficient documentation

## 2017-12-18 DIAGNOSIS — R05 Cough: Secondary | ICD-10-CM | POA: Diagnosis not present

## 2017-12-18 LAB — CBC
HCT: 45.8 % (ref 39.0–52.0)
HEMOGLOBIN: 15.6 g/dL (ref 13.0–17.0)
MCH: 30.2 pg (ref 26.0–34.0)
MCHC: 34.1 g/dL (ref 30.0–36.0)
MCV: 88.8 fL (ref 78.0–100.0)
Platelets: 280 10*3/uL (ref 150–400)
RBC: 5.16 MIL/uL (ref 4.22–5.81)
RDW: 12.9 % (ref 11.5–15.5)
WBC: 9.4 10*3/uL (ref 4.0–10.5)

## 2017-12-18 LAB — TROPONIN I

## 2017-12-18 LAB — BASIC METABOLIC PANEL
ANION GAP: 11 (ref 5–15)
BUN: 11 mg/dL (ref 6–20)
CHLORIDE: 102 mmol/L (ref 101–111)
CO2: 25 mmol/L (ref 22–32)
Calcium: 9.4 mg/dL (ref 8.9–10.3)
Creatinine, Ser: 0.81 mg/dL (ref 0.61–1.24)
GFR calc non Af Amer: >60 mL/min (ref >60)
Glucose, Bld: 94 mg/dL (ref 65–99)
Potassium: 4 mmol/L (ref 3.5–5.1)
Sodium: 138 mmol/L (ref 135–145)

## 2017-12-18 NOTE — ED Triage Notes (Signed)
Patient reports chest pain, anxiety and dizziness since yesterday. Also c/o dry mouth and lack of energy.

## 2017-12-19 ENCOUNTER — Telehealth: Payer: Self-pay

## 2017-12-19 ENCOUNTER — Emergency Department (HOSPITAL_COMMUNITY)
Admission: EM | Admit: 2017-12-19 | Discharge: 2017-12-19 | Disposition: A | Discharge status: Home / Self Care | Payer: 59 | Attending: Emergency Medicine | Admitting: Emergency Medicine

## 2017-12-19 DIAGNOSIS — R0789 Other chest pain: Secondary | ICD-10-CM

## 2017-12-19 LAB — TROPONIN I: Troponin I: <0.03 ng/mL (ref <0.03)

## 2017-12-19 NOTE — Telephone Encounter (Signed)
good

## 2017-12-19 NOTE — ED Provider Notes (Signed)
[facility] EMERGENCY DEPARTMENT Provider Note   CSN: [id number] Arrival date & time: 12/18/17  2121     History   Chief Complaint Chief Complaint  Patient presents with  . Chest Pain    HPI Thomas Hobbs is a 42 y.o. male.  The history is provided by the patient.  Chest Pain    He has a history of GERD and depression, and comes in with a vague discomfort in his chest which is been present since this morning.  He describes a tight, heavy, pressure feeling which is constant.  Nothing makes it better, nothing makes it worse.  He specifically denies any exertional component and it is not affected by body position or movement.  There is no associated dyspnea, nausea, diaphoresis.  He had a similar episode about a month ago which lasted about 3 days.  He did go to an emergency department, but left without being seen because of a long wait.  He does admit to smoking 1.5 packs of cigarettes a day, and there is a family history of premature coronary atherosclerosis with his father having died at age 16 of a heart attack.  He denies history of diabetes or hypertension or hyperlipidemia.  He has not done anything to try to treat his symptoms.  He rates his discomfort at 8/10.  Past Medical History:  Diagnosis Date  . Depression   . GERD (gastroesophageal reflux disease)   . Hydrocele     Patient Active Problem List   Diagnosis Date Noted  . Esophageal reflux 11/05/2014  . Essential tremor 03/31/2014  . Depression with anxiety 02/13/2013  . Contact dermatitis 02/13/2013    Past Surgical History:  Procedure Laterality Date  . BIOPSY  07/24/2016   Procedure: BIOPSY;  Surgeon: Malissa Hippo, MD;  [facility];  Service: Endoscopy;;  Gastric biopsy  . ESOPHAGOGASTRODUODENOSCOPY N/A 07/24/2016   Procedure: ESOPHAGOGASTRODUODENOSCOPY (EGD);  Surgeon: Malissa Hippo, MD;  Location: [facility];  Service: Endoscopy;  Laterality: N/A;  12:00  . NO PAST SURGERIES          Home Medications    Prior to Admission medications   Medication Sig Start Date End Date Taking? Authorizing Provider  pantoprazole (PROTONIX) 40 MG tablet TAKE 1 TABLET(40 MG) BY MOUTH TWICE DAILY BEFORE A MEAL 02/03/17   Rehman, Joline Maxcy, MD  sertraline (ZOLOFT) 50 MG tablet 1/2 tab qam for 7 days then one tab qam 11/26/17   Merlyn Albert, MD  sildenafil (REVATIO) 20 MG tablet 2-3 tabs po 2 hours prior to sex 11/26/17   Merlyn Albert, MD    Family History Family History  Problem Relation Age of Onset  . Heart attack Father   . Heart attack Other     Social History Social History   Tobacco Use  . Smoking status: Current Every Day Smoker    Packs/day: 1.50    Types: Cigarettes  . Smokeless tobacco: Never Used  Substance Use Topics  . Alcohol use: No    Comment: occ  . Drug use: No     Allergies   Augmentin [amoxicillin-pot clavulanate]; Cefzil [cefprozil]; Levaquin [levofloxacin in d5w]; and Other   Review of Systems Review of Systems  Cardiovascular: Positive for chest pain.  All other systems reviewed and are negative.    Physical Exam Updated Vital Signs BP 123/78   Pulse 61   Temp 98.4 F (36.9 C) (Oral)   Resp 18   Ht 5\' 4"  (  1.626 m)   Wt 65.8 kg (145 lb)   SpO2 98%   BMI 24.89 kg/m   Physical Exam  Nursing note and vitals reviewed.  42 year old male, resting comfortably and in no acute distress. Vital signs are normal. Oxygen saturation is 98%, which is normal. Head is normocephalic and atraumatic. PERRLA, EOMI. Oropharynx is clear. Neck is nontender and supple without adenopathy or JVD. Back is nontender and there is no CVA tenderness. Lungs are clear without rales, wheezes, or rhonchi. Chest is nontender. Heart has regular rate and rhythm without murmur. Abdomen is soft, flat, nontender without masses or hepatosplenomegaly and peristalsis is normoactive. Extremities have no cyanosis or edema, full range of motion is  present. Skin is warm and dry without rash. Neurologic: Mental status is normal, cranial nerves are intact, there are no motor or sensory deficits.  ED Treatments / Results  Labs (all labs ordered are listed, but only abnormal results are displayed) Labs Reviewed  BASIC METABOLIC PANEL  CBC  TROPONIN I  TROPONIN I    EKG  EKG Interpretation  Date/Time:  Thursday December 18 2017 21:31:05 EST Ventricular Rate:  78 PR Interval:  126 QRS Duration: 92 QT Interval:  370 QTC Calculation: 421 R Axis:   64 Text Interpretation:  Normal sinus rhythm Minimal voltage criteria for LVH, may be normal variant Borderline ECG No old tracing to compare Confirmed by Dione BoozeGlick, Konstantinos Cordoba ([id number]) on 12/19/2017 1:33:50 AM       Radiology Dg Chest 2 View  Result Date: 12/18/2017 CLINICAL DATA:  General chest tightness, sob and dizziness since yesterday. Denies cough. History of GERD. EXAM: CHEST  2 VIEW COMPARISON:  None. FINDINGS: Heart size and mediastinal contours are within normal limits. Lungs are clear. No pleural effusion or pneumothorax seen. Mild degenerative change noted within the scoliotic thoracic spine. No acute or suspicious osseous finding. IMPRESSION: No active cardiopulmonary disease. No evidence of pneumonia or pulmonary edema. Scoliosis of the thoracolumbar spine. Electronically Signed   By: Bary RichardStan  Maynard M.D.   On: 12/18/2017 22:20    Procedures Procedures (including critical care time)  Medications Ordered in ED Medications - No data to display   Initial Impression / Assessment and Plan / ED Course  I have reviewed the triage vital signs and the nursing notes.  Pertinent labs & imaging results that were available during my care of the patient were reviewed by me and considered in my medical decision making (see chart for details).  Chest discomfort of uncertain cause.  ECG shows changes of left ventricular hypertrophy, but no ST or T changes.  Chest x-ray is normal except for  scoliosis of the thoracolumbar spine.  Initial troponin is normal as is CBC and basic metabolic panel.  Heart score is 2, which puts him at low risk for major adverse cardiac events in the next 30 days.  Will check delta troponin.  If negative, will refer back to PCP for consideration for outpatient stress testing.  Encouraged to stop smoking.  Old records are reviewed, and he has no relevant past visits.  Repeat troponin is normal.  He is discharged with above-noted instructions.  Final Clinical Impressions(s) / ED Diagnoses   Final diagnoses:  Atypical chest pain    ED Discharge Orders    None       Dione BoozeGlick, Pryor Guettler, MD 12/19/17 [id number]

## 2017-12-19 NOTE — Telephone Encounter (Signed)
Patient called today states he went to  The ed/ hospital for chest tightness last night because he felt his whole world was crashing.He states he had been on zoloft and was tapering himself off of it and states at the last office visit was told to try wellbutrin and he came off of this as well. He states he had went back on the zoloft on Wednesday he took 50 mg and on Thursday took 50 mg after leaving the hospital this am he came home and took zoloft 100 mg . He now feels shaky,anxious,vomiting,diarrhea,has not ate anything since yesterday.He states his chext x ray and ekg were fine at the hospital last night,but they recommend that he see us for us to set up a stress test.Per Dr.Steve give an appt for Monday take zoloft 50mg  per day and if gets worse return to the ED.

## 2017-12-19 NOTE — Discharge Instructions (Signed)
Please talk with Dr. Gerda DissLuking about possible referral to a cardiologist for stress testing. Return if you are having any problems.

## 2017-12-22 ENCOUNTER — Ambulatory Visit (INDEPENDENT_AMBULATORY_CARE_PROVIDER_SITE_OTHER): Payer: 59 | Admitting: Family Medicine

## 2017-12-22 VITALS — BP 122/78 | Ht 64.5 in | Wt 141.8 lb

## 2017-12-22 DIAGNOSIS — R079 Chest pain, unspecified: Secondary | ICD-10-CM | POA: Diagnosis not present

## 2017-12-22 MED ORDER — CLONAZEPAM 1 MG PO TABS: 1.0000 mg | ORAL_TABLET | Freq: Four times a day (QID) | ORAL | 1 refills | Status: DC | PRN

## 2017-12-22 NOTE — Progress Notes (Signed)
   Subjective:    Patient ID: Thomas Hobbs, male    DOB: 08/12/1976, 42 y.o.   MRN: [id number] Patient arrives office for a protracted discussion regarding his concerns worries and symtoms HPI Patient arrives for a follow up from a recent ER visit for chest pain.  Patient has been "messing" with his Zoloft medication . Patient stated he was worried it was related to his heart so he went to ER and they cleared him from a cardiac standpoint but patient stated a nap and ASA makes him feel better so he not sure if it is from the zoloft or his heart.  Pt went to e r thur night and had substantial work up     Pt took an aspirin on sat at family's recommendation, took an aspirin. One hr or two got to feeling better  Felt horrible yest morn again with  Thru the day, then took aspirin again, and got to feeling beettter  Fa has hx of having died at 89 from heart attack      Full emergency room evaluation workup reviewed in presence of patient              Claims definite anxiety but no suicidal or homicidal thoughts.                   Review of Systems No headache, no major weight loss or weight gain, no chest pain no back pain abdominal pain no change in bowel habits complete ROS otherwise negative     Objective:   Physical Exam Alert and oriented, vitals reviewed and stable, NAD ENT-TM's and ext canals WNL bilat via otoscopic exam Soft palate, tonsils and post pharynx WNL via oropharyngeal exam Neck-symmetric, no masses; thyroid nonpalpable and nontender Pulmonary-no tachypnea or accessory muscle use; Clear without wheezes via auscultation Card--no abnrml murmurs, rhythm reg and rate WNL Carotid pulses symmetric, without bruits   EKG normal sinus rhythm.  Nonspecific ST-T change      Assessment & Plan:  1 impression chest pain.  Doubt cardiac etiology.  However there are substantial risk factors and patient very concerned about his heart he has been  taking one half aspirin daily 160 mg equivalent and he thinks it definitely helps him.  We will press on with cardiology referral discussed  2.  Anxiety/depression patient has had some serotonin withdrawal symptoms but I think the misery of his last few days or more due to anxiety because of no medication.  Encouraged to stay on Zoloft long-term.  Also clonazepam for short-term  Greater than 50% of this 25 minute face to face visit was spent in counseling and discussion and coordination of care regarding the above diagnosis/diagnosies

## 2017-12-23 ENCOUNTER — Encounter: Payer: Self-pay | Admitting: Family Medicine

## 2017-12-24 ENCOUNTER — Encounter: Payer: Self-pay | Admitting: Family Medicine

## 2018-01-19 ENCOUNTER — Ambulatory Visit: Payer: 59 | Admitting: Cardiovascular Disease

## 2018-01-19 DIAGNOSIS — J019 Acute sinusitis, unspecified: Secondary | ICD-10-CM | POA: Diagnosis not present

## 2018-01-23 ENCOUNTER — Telehealth: Payer: Self-pay | Admitting: Family Medicine

## 2018-01-23 MED ORDER — SERTRALINE HCL 100 MG PO TABS: 100.0000 mg | ORAL_TABLET | Freq: Every day | ORAL | 0 refills | Status: DC

## 2018-01-23 NOTE — Telephone Encounter (Signed)
Pt states he has been back on zoloft 50mg  for 3 -4 weeks. Better now than what he was when he was seen. He is having to take one clonazepam a day. He states he is having some tough days still but better. Wants to know should he give med more time or does it need to be changed. Not thoughts of hurting himself.

## 2018-01-23 NOTE — Telephone Encounter (Signed)
Ok lets incr zoloft to 100 mg daily and call us in several weeks with status or before if need be

## 2018-01-23 NOTE — Telephone Encounter (Signed)
Patient said that Dr. Brett CanalesSteve put him back on the Zoloft at the beginning of February and also gave him Rx for clonazepam.  He said he is having some pretty tough days sometimes, but is able to get by with just 1 clonazepam.  He wants to know how long it should take before the Zoloft will get back in his system and regulate his moods?

## 2018-01-23 NOTE — Telephone Encounter (Signed)
Medication sent in to the pharmacy. Pt is aware of all.

## 2018-01-26 ENCOUNTER — Telehealth: Payer: Self-pay | Admitting: Family Medicine

## 2018-01-26 NOTE — Telephone Encounter (Signed)
Needs o v for all of this, we will disc all when seen

## 2018-01-26 NOTE — Telephone Encounter (Signed)
Patient contacted and is scheduling an office visit for discussion.

## 2018-01-26 NOTE — Telephone Encounter (Signed)
Pt called stating that he went back to taking the 50 mg of zoloft because doubling the dosage made it worse. Pt also states that [facility] and was prescribed doxycycline for a sinus inf. Pt states that he is better but still has congestion. Pt wants to know if he needs to be rechecked. Pt is also going [place] and is needing to know if he needs to get any immunizations.  PLEASE ADVISE.

## 2018-01-27 ENCOUNTER — Ambulatory Visit: Payer: 59 | Admitting: Family Medicine

## 2018-01-27 ENCOUNTER — Encounter: Payer: Self-pay | Admitting: Family Medicine

## 2018-01-27 VITALS — BP 116/78 | Ht 64.5 in | Wt 138.1 lb

## 2018-01-27 DIAGNOSIS — J019 Acute sinusitis, unspecified: Secondary | ICD-10-CM | POA: Diagnosis not present

## 2018-01-27 DIAGNOSIS — F418 Other specified anxiety disorders: Secondary | ICD-10-CM

## 2018-01-27 IMAGING — US US SCROTUM
1 series · 14 of 25 positions shown · non-contrast
Comparison: None.

CLINICAL DATA: History of right scrotal pressure over the last
month, probable hydrocele, no injury or tenderness

EXAM:
ULTRASOUND OF SCROTUM
TECHNIQUE: Complete ultrasound examination of the testicles, epididymis, and
other scrotal structures was performed.

[Series 1: us scrotum · 0.13mm/px · 14 of 68 slices shown]
[im 1/68]
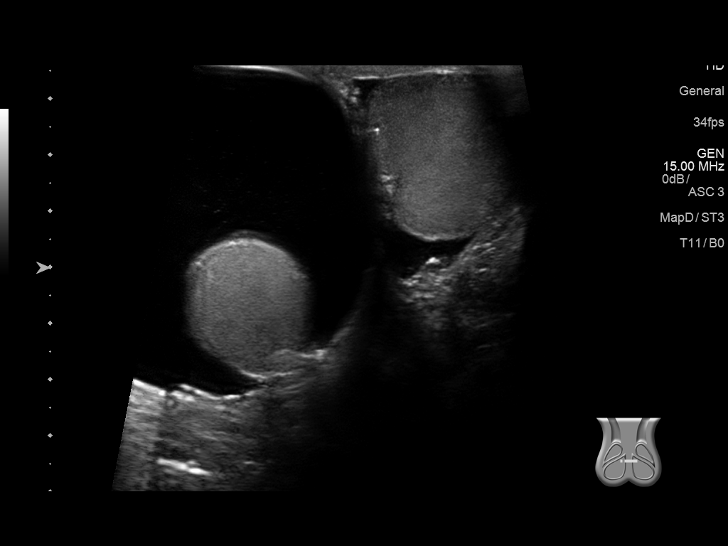
[im 6/68]
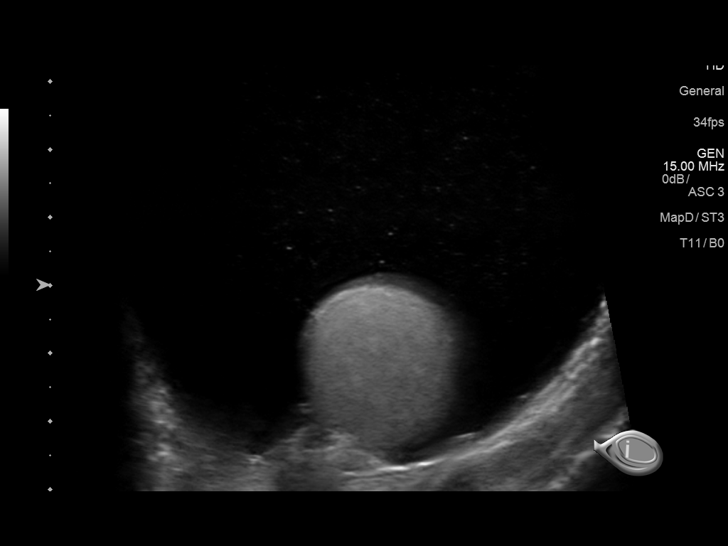
[im 12/68]
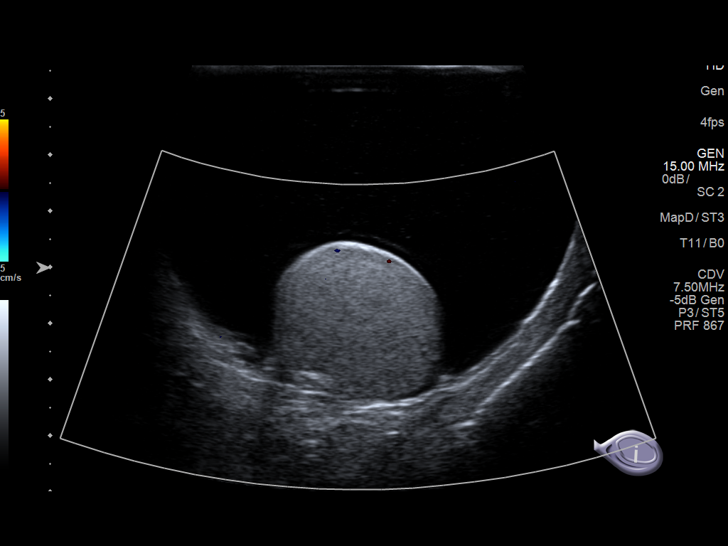
[im 17/68]
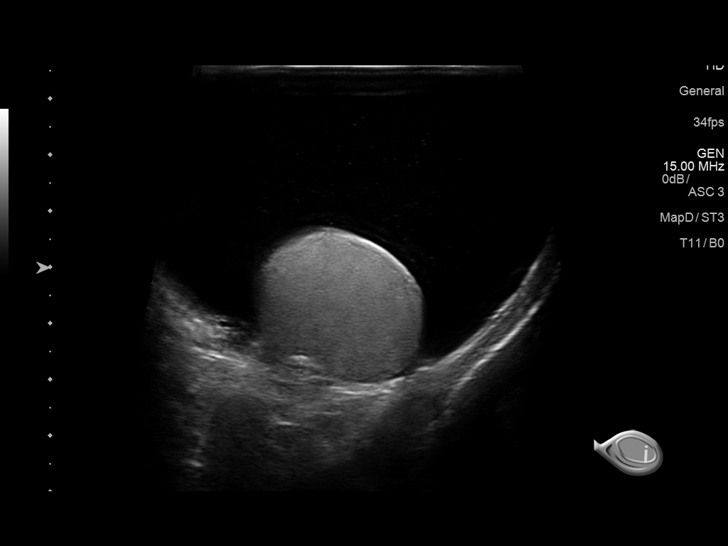
[im 23/68]
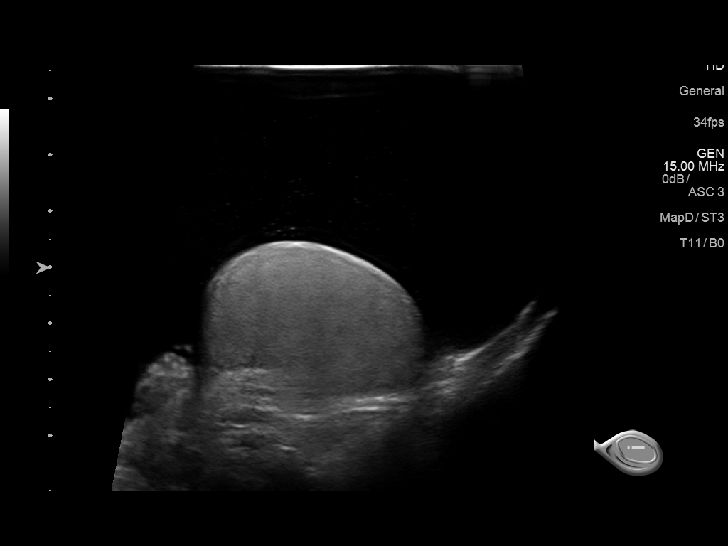
[im 26/68]
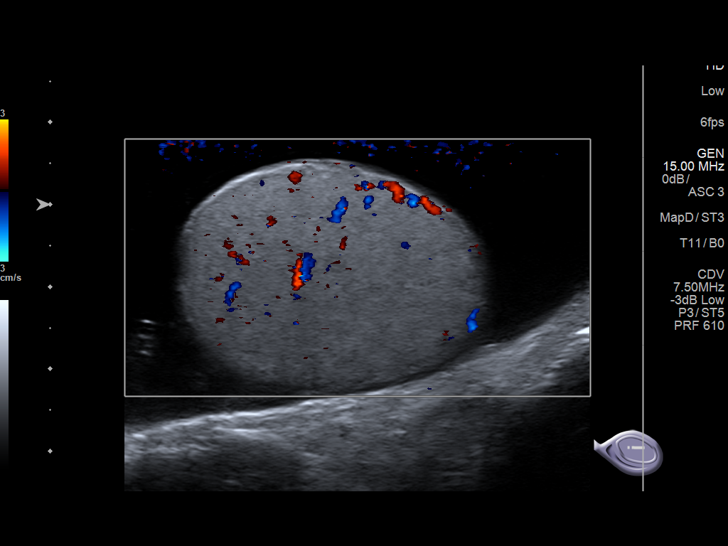
[im 31/68]
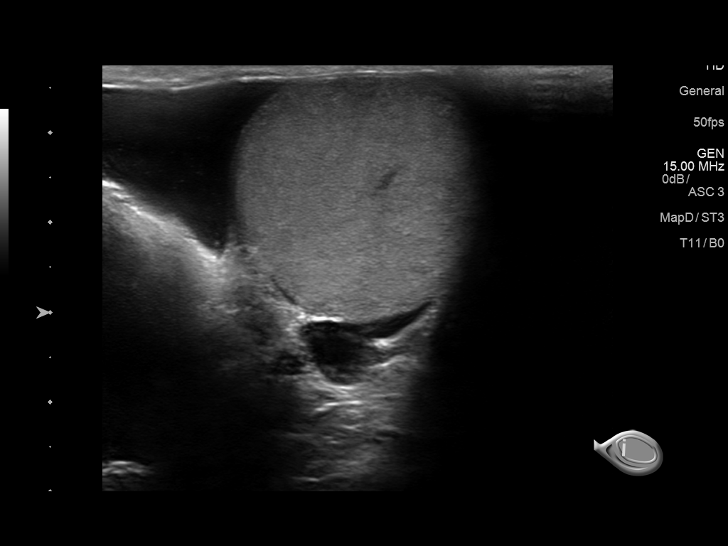
[im 37/68]
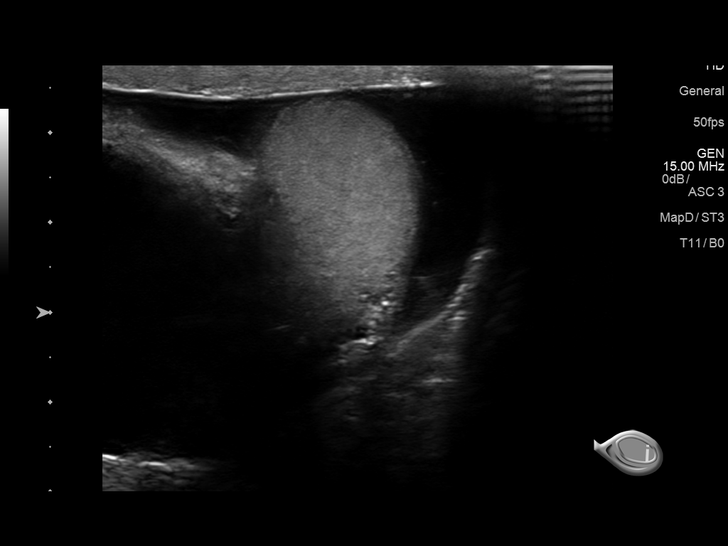
[im 42/68]
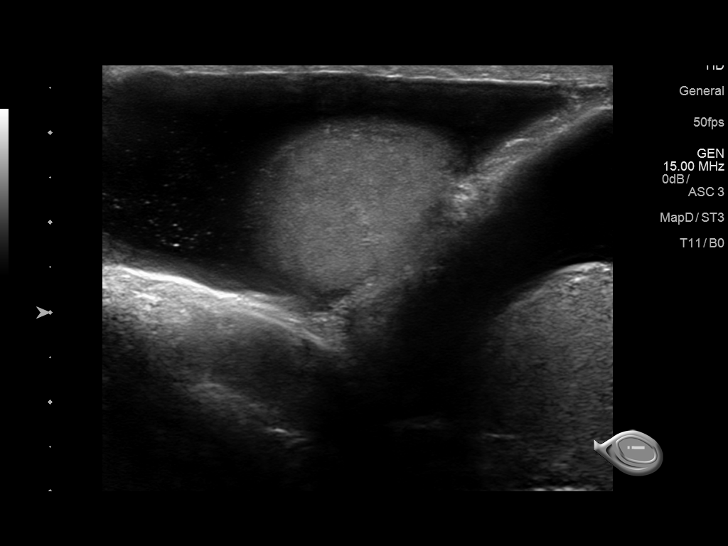
[im 45/68]
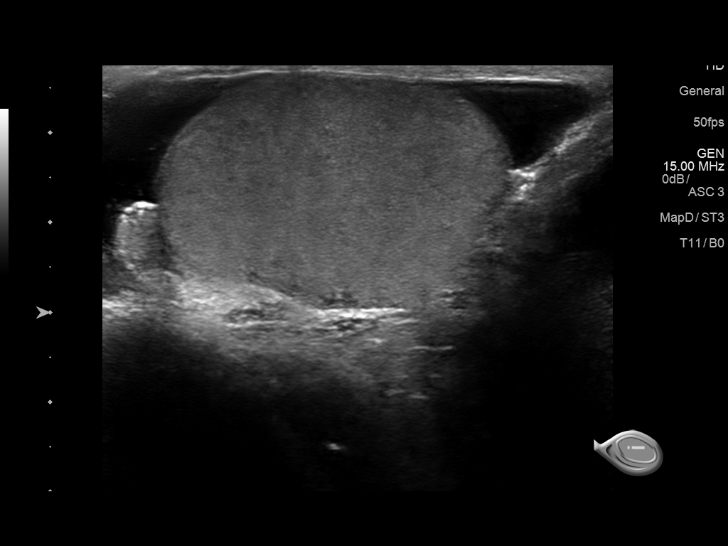
[im 51/68]
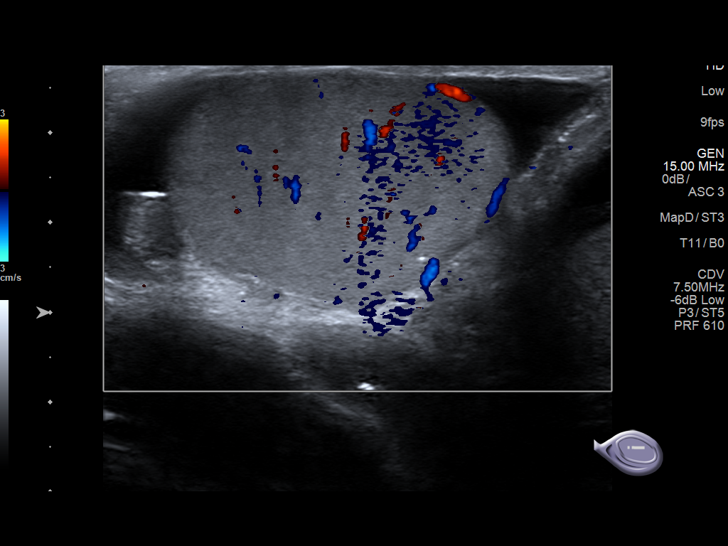
[im 56/68]
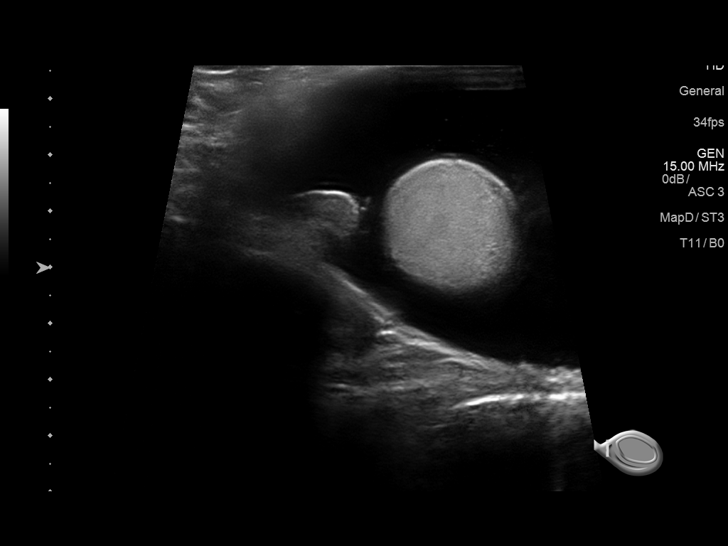
[im 62/68]
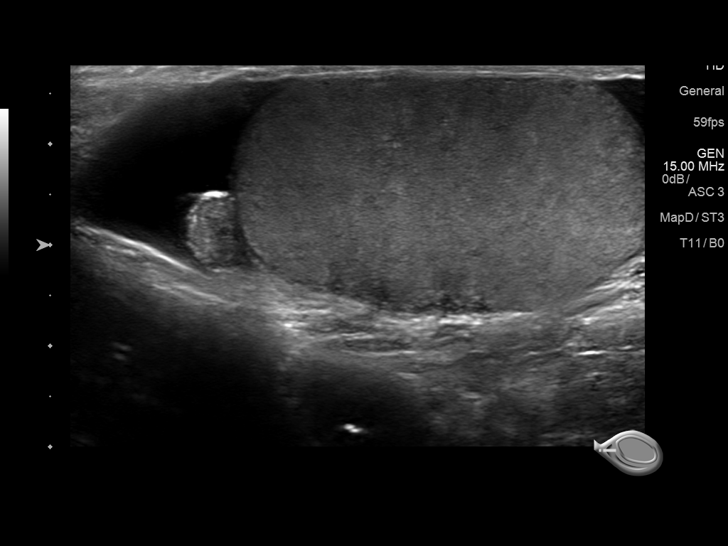
[im 68/68]
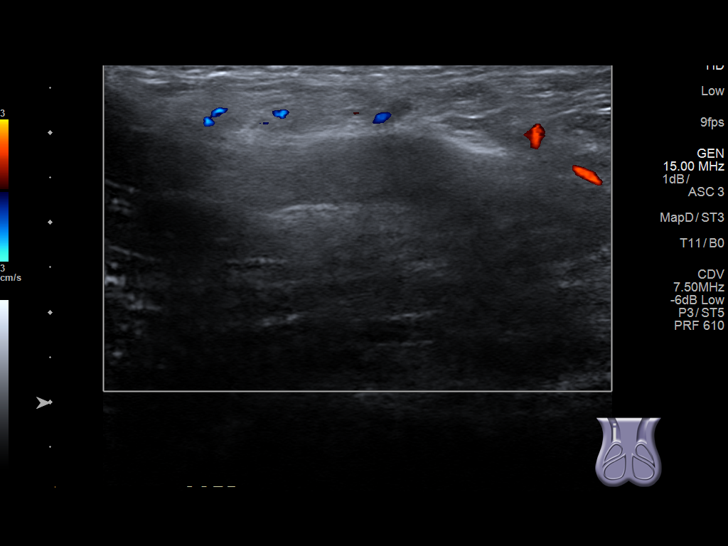

[14 of 25 positions shown; findings below may reference images not displayed]

FINDINGS: Right testicle

Measurements: The right testicle measures 4.1 x 2.7 x 3.0 cm.. No
intratesticular abnormality is seen. Blood flow is demonstrated to
the right testicle with arterial and venous waveforms.

Left testicle

Measurements: The left testicle measures 4.3 x 2.8 x 3.0 cm.. No
intratesticular abnormality is seen. Blood flow is demonstrated to
the left testicle with arterial and venous waveforms.

Right epididymis:  The right epididymis is unremarkable.

Left epididymis:  The left epididymis also is unremarkable.

Hydrocele: Bilateral hydroceles are present right greater than left.

Varicocele:  No varicocele is seen.
IMPRESSION: 1. Bilateral hydroceles right much greater than left.
2. No intratesticular abnormality. Blood flow is demonstrated to
both testicles.

## 2018-01-27 MED ORDER — AMOXICILLIN 500 MG PO CAPS: 500.0000 mg | ORAL_CAPSULE | Freq: Three times a day (TID) | ORAL | 0 refills | Status: DC

## 2018-01-27 MED ORDER — CIPROFLOXACIN HCL 500 MG PO TABS: 500.0000 mg | ORAL_TABLET | Freq: Two times a day (BID) | ORAL | 0 refills | Status: DC

## 2018-01-27 MED ORDER — SERTRALINE HCL 50 MG PO TABS: 50.0000 mg | ORAL_TABLET | Freq: Every day | ORAL | 5 refills | Status: DC

## 2018-01-27 NOTE — Progress Notes (Signed)
   Subjective:    Patient ID: Thomas Hobbs, male    DOB: 11/13/1976, 42 y.o.   MRN: [id number] Patient presents with 3 distinct concerns HPI  Patient is here today to discuss immunizations for his trip to the [place]. He states you had started him on Zoloft 100 mg he states that was too much for him so he went back to the 50 mg daily. States he had a sinus infection that started two weeks ago saw a urgent care they gave him doxycycline he states he finished it yesterday and it is no better.Wanted to know if her needs another round of antibx.  Going [place] for mid April  Will be there two to three weeks.  [place]./Frontal headache and goes sharp at times worse with    [place]pt [place] in [place], moving to [place], is pregnant  zoloft 100 feels like too much, feels like anxiety flaring up at highter dose notes ongoing stress and anxiety.  His current fiance is pregnant so this is stress concerns  pt has no inlinatin to take a higher dose on the zoloft     Pos sinus area pressure and gunkiness and discharge  Having challenges with persit ocongestion.  Patient wonders if you can take so.  Review of Systems No headache, no major weight loss or weight gain, no chest pain no back pain abdominal pain no change in bowel habits complete ROS otherwise negative     Objective:   Physical Exam  Alert and oriented, vitals reviewed and stable, NAD ENT-TM's and ext canals WNL bilat via otoscopic exam Soft palate, tonsils and post pharynx WNL via oropharyngeal exam Neck-symmetric, no masses; thyroid nonpalpable and nontender Pulmonary-no tachypnea or accessory muscle use; Clear without wheezes via auscultation Card--no abnrml murmurs, rhythm reg and rate WNL Carotid pulses symmetric, without bruits       Assessment & Plan:  Impression 1  rhinosinusitis/persistent  2.  Foreign travel plans.  Research project done in patient's presence.  CDC for verbal pains recommends coverage for Japanese encephalitis, typhoid, hepatitis A.  All of this information given to patient.  Needs to go to travel clinic  3.  Depression with frustration regarding medicines.  After long discussion recommend staying right at the same dose rationale discussed  Greater than 50% of this 25 minute face to face visit was spent in counseling and discussion and coordination of care regarding the above diagnosis/diagnosies

## 2018-01-27 NOTE — Patient Instructions (Addendum)
[place]  Hep A series  Japanese encephalitis    Typhoid vaccine-year-old

## 2018-02-09 ENCOUNTER — Other Ambulatory Visit: Payer: Self-pay | Admitting: Family Medicine

## 2018-02-09 NOTE — Telephone Encounter (Signed)
Ok plus one ref 

## 2018-02-16 ENCOUNTER — Other Ambulatory Visit (INDEPENDENT_AMBULATORY_CARE_PROVIDER_SITE_OTHER): Payer: Self-pay | Admitting: Internal Medicine

## 2018-03-24 ENCOUNTER — Encounter: Payer: Self-pay | Admitting: Family Medicine

## 2018-03-24 ENCOUNTER — Ambulatory Visit: Payer: 59 | Admitting: Family Medicine

## 2018-03-24 VITALS — BP 122/70 | Ht 64.5 in | Wt 136.8 lb

## 2018-03-24 DIAGNOSIS — K2100 Gastro-esophageal reflux disease with esophagitis, without bleeding: Secondary | ICD-10-CM

## 2018-03-24 DIAGNOSIS — F418 Other specified anxiety disorders: Secondary | ICD-10-CM | POA: Diagnosis not present

## 2018-03-24 DIAGNOSIS — G250 Essential tremor: Secondary | ICD-10-CM

## 2018-03-24 MED ORDER — SERTRALINE HCL 50 MG PO TABS: 50.0000 mg | ORAL_TABLET | Freq: Every day | ORAL | 5 refills | Status: DC

## 2018-03-24 MED ORDER — PANTOPRAZOLE SODIUM 40 MG PO TBEC: DELAYED_RELEASE_TABLET | ORAL | 11 refills | Status: DC

## 2018-03-24 MED ORDER — CLONAZEPAM 1 MG PO TABS: ORAL_TABLET | ORAL | 5 refills | Status: DC

## 2018-03-24 NOTE — Progress Notes (Signed)
   Subjective:    Patient ID: Thomas Hobbs, male    DOB: 1976-05-29, 42 y.o.   MRN: [id number]   Anxiety   Presents for follow-up visit.    pt states klonopin is helping but does not think zoloft is doing much. Does not feel like himself for the past 6 months.   States overall the clonopin is helping  Pt went up to 100 zoloft and that causeed more side effects then benefit  Backed off to 50   Pt tried to back down to 25, and that caused some withdrawal symtoms    Would like to get refill on protonix. Ordered by speciailist. Pt uses faithfully, twice per day. Handling meds well, tolerating. Asks that we take over rx, now that is out of med omeprazole not helping . Pt had backe off to once per day   Job fair amnt of phus activity, does a lot of walking and some lifting   Pressure in left ear.recently flew on a plane.     Knot on abdomen. noticied it around April 14th. Pos  Found spot in umpper mid abdomen. Wonders about     Review of Systems No headache, no major weight loss or weight gain, no chest pain no back pain abdominal pain no change in bowel habits complete ROS otherwise negative     Objective:   Physical Exam  Alert and oriented, vitals reviewed and stable, NAD ENT-TM's and ext canals WNL bilat via otoscopic exam Soft palate, tonsils and post pharynx WNL via oropharyngeal exam Neck-symmetric, no masses; thyroid nonpalpable and nontender Pulmonary-no tachypnea or accessory muscle use; Clear without wheezes via auscultation Card--no abnrml murmurs, rhythm reg and rate WNL Carotid pulses symmetric, without bruits Upper mid abdominal exam reveals xiphoid process.  Somewhat protuberant but within normal limits      impression xiphoid process within normal limits discussed     Assessment & Plan:  1  2.  Chronic anxiety with element of depression.  Ongoing challenge.  After very long discussion patient agrees to maintain current dose of Zoloft  3.   Reflux clinically stable on medication.  Patient wishes to maintain.  Not due without discussed  Greater than 50% of this 25 minute face to face visit was spent in counseling and discussion and coordination of care regarding the above diagnosis/diagnosies

## 2018-04-22 ENCOUNTER — Telehealth: Payer: Self-pay | Admitting: *Deleted

## 2018-04-22 NOTE — Telephone Encounter (Signed)
Patient called requesting a refill on all 3 of his medications, patient states he is going out of the country on June 17th. Please advise      [facility] [place]

## 2018-04-22 NOTE — Telephone Encounter (Signed)
Pt would like refills on Klonopin, Protonix and Zoloft. Last seen 03/24/2018

## 2018-04-23 MED ORDER — CLONAZEPAM 1 MG PO TABS: ORAL_TABLET | ORAL | 3 refills | Status: DC

## 2018-04-23 MED ORDER — SERTRALINE HCL 50 MG PO TABS: 50.0000 mg | ORAL_TABLET | Freq: Every day | ORAL | 3 refills | Status: DC

## 2018-04-23 MED ORDER — PANTOPRAZOLE SODIUM 40 MG PO TBEC: DELAYED_RELEASE_TABLET | ORAL | 3 refills | Status: DC

## 2018-04-23 NOTE — Telephone Encounter (Signed)
Klonopin faxed and pt informed

## 2018-04-23 NOTE — Telephone Encounter (Signed)
All pt's medications have been sent to the pharmacy. Klonipin was printed awaiting signature and will fax.

## 2018-04-23 NOTE — Telephone Encounter (Signed)
Ok times three months

## 2018-06-24 ENCOUNTER — Ambulatory Visit: Payer: 59 | Admitting: Family Medicine

## 2018-08-07 ENCOUNTER — Telehealth: Payer: Self-pay | Admitting: Family Medicine

## 2018-08-07 MED ORDER — PANTOPRAZOLE SODIUM 40 MG PO TBEC: DELAYED_RELEASE_TABLET | ORAL | 3 refills | Status: DC

## 2018-08-07 NOTE — Telephone Encounter (Signed)
Prescription sent electronically to pharmacy. Patient notified. 

## 2018-08-07 NOTE — Telephone Encounter (Signed)
Patient is going out of town and will run out of pantoprazole (PROTONIX) 40 MG tablet while on vacation, patient requesting refill for medication before going out of town so he wont run out. He is made aware he will be filling this medication a few days before fill date for insurance purposes and patient is still requesting medication to be refilled. Advise.   [id number] - [place], [place] - [address]

## 2018-08-25 ENCOUNTER — Telehealth: Payer: Self-pay | Admitting: Family Medicine

## 2018-08-25 NOTE — Telephone Encounter (Signed)
[place] and is looking for some advice on the symptoms he is having. He is having some sinus pressure, sneezing, cough, and sinuses burning at back of his sinuses. He is wanting to know if it is safe to take phenylpropanolamine hci with his sertraline (ZOLOFT) 50 MG tablet. When he looked it showed it was a drug interference between the two. He also wants to know if Dr. Brett Canales thinks he should see the doc down there. There is a 12 hr time difference if it is late when we call he asked for [place] to just leave a voice mail advising him what to do.

## 2018-08-25 NOTE — Telephone Encounter (Signed)
Please advise 

## 2018-08-25 NOTE — Telephone Encounter (Signed)
Can take the decongestant wirh the zoloft if feels bad should seek local assitance we can not manage the pt's concerns in the [place]

## 2018-08-25 NOTE — Telephone Encounter (Signed)
Left detailed message stating that it was ok to take decongestant and Zoloft. If he feels bad enough he should seek local assistance and that we can not manage patient concerns while in the [place] Left number to office to call back if any questions or concerns.

## 2018-08-31 DIAGNOSIS — R062 Wheezing: Secondary | ICD-10-CM | POA: Diagnosis not present

## 2018-08-31 DIAGNOSIS — J189 Pneumonia, unspecified organism: Secondary | ICD-10-CM | POA: Diagnosis not present

## 2018-09-01 ENCOUNTER — Telehealth: Payer: Self-pay | Admitting: Family Medicine

## 2018-09-01 NOTE — Telephone Encounter (Signed)
Contacted patient. Pt verbalized understanding. 

## 2018-09-01 NOTE — Telephone Encounter (Signed)
Please advise 

## 2018-09-01 NOTE — Telephone Encounter (Signed)
[facility] diagnosed with sinus with infection and walking pneumonia. Pt wants to know if he would still be considered contagious. Pt is currently on z-pak, pt took 2 pills last night and one pill this morning. Advise.

## 2018-09-01 NOTE — Telephone Encounter (Signed)
Yes need to be on antibiotics three full days

## 2018-09-04 DIAGNOSIS — R05 Cough: Secondary | ICD-10-CM | POA: Diagnosis not present

## 2018-09-04 DIAGNOSIS — J069 Acute upper respiratory infection, unspecified: Secondary | ICD-10-CM | POA: Diagnosis not present

## 2018-09-09 ENCOUNTER — Other Ambulatory Visit: Payer: Self-pay | Admitting: Family Medicine

## 2018-09-14 ENCOUNTER — Encounter: Payer: Self-pay | Admitting: Family Medicine

## 2018-09-14 ENCOUNTER — Ambulatory Visit: Payer: 59 | Admitting: Family Medicine

## 2018-09-14 VITALS — BP 124/78 | Temp 98.2°F | Ht 64.5 in | Wt 137.0 lb

## 2018-09-14 DIAGNOSIS — J019 Acute sinusitis, unspecified: Secondary | ICD-10-CM

## 2018-09-14 DIAGNOSIS — M542 Cervicalgia: Secondary | ICD-10-CM | POA: Diagnosis not present

## 2018-09-14 MED ORDER — DOXYCYCLINE HYCLATE 100 MG PO TABS: 100.0000 mg | ORAL_TABLET | Freq: Two times a day (BID) | ORAL | 0 refills | Status: DC

## 2018-09-14 MED ORDER — NAPROXEN 500 MG PO TABS: 500.0000 mg | ORAL_TABLET | Freq: Two times a day (BID) | ORAL | 0 refills | Status: DC

## 2018-09-14 NOTE — Progress Notes (Signed)
   Subjective:    Patient ID: Thomas Hobbs, male    DOB: 1976/02/21, 42 y.o.   MRN: [id number]  Neck Pain   The current episode started more than 1 month ago. Associated with: PT was in [place] 3 weeks ago. The symptoms are aggravated by bending. Associated symptoms include headaches. Pertinent negatives include no chest pain or fever. He has tried NSAIDs for the symptoms.  Sinusitis  This is a new problem. The current episode started in the past 7 days. Associated symptoms include congestion, coughing, ear pain, headaches, neck pain and sinus pressure. Pertinent negatives include no chills. (Wheezing)   Pt was out of country and came back with neck pain. Pt states that he also has burning in sinus areas. Pt states it feels like a sunburn, also has a strange feeling in mouth. [facility].    Review of Systems  Constitutional: Negative for activity change, chills and fever.  HENT: Positive for congestion, ear pain, rhinorrhea and sinus pressure.   Eyes: Negative for discharge.  Respiratory: Positive for cough. Negative for wheezing.   Cardiovascular: Negative for chest pain.  Gastrointestinal: Negative for nausea and vomiting.  Musculoskeletal: Positive for neck pain. Negative for arthralgias.  Neurological: Positive for headaches.       Objective:   Physical Exam  Constitutional: He appears well-developed.  HENT:  Head: Normocephalic.  Mouth/Throat: Oropharynx is clear and moist. No oropharyngeal exudate.  Neck: Normal range of motion.  Cardiovascular: Normal rate, regular rhythm and normal heart sounds.  No murmur heard. Pulmonary/Chest: Effort normal and breath sounds normal. He has no wheezes.  Lymphadenopathy:    He has no cervical adenopathy.  Neurological: He exhibits normal muscle tone.  Skin: Skin is warm and dry.  Nursing note and vitals reviewed.  Neck is supple Subjective neck pain Warning signs were discussed with the  patient  Patient was in the [place] got married states he was exposed to a lot of germs    Assessment & Plan:  Neck exercises recommended range of motion exercises continue stretches plus also anti-inflammatory twice daily for the next 7 to 10 days  Sinusitis antibiotic prescribed warning signs discussed follow-up if ongoing troubles  I find no evidence of meningitis I do not recommend any type of x-rays lumbar puncture or lab work currently

## 2018-09-15 LAB — CBC WITH DIFFERENTIAL/PLATELET
BASOS: 1 %
Basophils Absolute: 0.1 10*3/uL (ref 0.0–0.2)
EOS (ABSOLUTE): 0.4 10*3/uL (ref 0.0–0.4)
EOS: 4 %
HEMOGLOBIN: 15.4 g/dL (ref 13.0–17.7)
Hematocrit: 47.5 % (ref 37.5–51.0)
Immature Grans (Abs): 0 10*3/uL (ref 0.0–0.1)
Immature Granulocytes: 0 %
LYMPHS ABS: 2.5 10*3/uL (ref 0.7–3.1)
Lymphs: 25 %
MCH: 29.5 pg (ref 26.6–33.0)
MCHC: 32.4 g/dL (ref 31.5–35.7)
MCV: 91 fL (ref 79–97)
MONOCYTES: 7 %
MONOS ABS: 0.7 10*3/uL (ref 0.1–0.9)
NEUTROS ABS: 6.1 10*3/uL (ref 1.4–7.0)
Neutrophils: 63 %
PLATELETS: 295 10*3/uL (ref 150–450)
RBC: 5.22 x10E6/uL (ref 4.14–5.80)
RDW: 12.9 % (ref 12.3–15.4)
WBC: 9.8 10*3/uL (ref 3.4–10.8)

## 2018-09-15 LAB — HEPATIC FUNCTION PANEL
ALBUMIN: 4.3 g/dL (ref 3.5–5.5)
ALT: 7 IU/L (ref 0–44)
AST: 14 IU/L (ref 0–40)
Alkaline Phosphatase: 98 IU/L (ref 39–117)
Bilirubin Total: 0.2 mg/dL (ref 0.0–1.2)
Bilirubin, Direct: 0.06 mg/dL (ref 0.00–0.40)
TOTAL PROTEIN: 6.6 g/dL (ref 6.0–8.5)

## 2018-09-26 ENCOUNTER — Other Ambulatory Visit: Payer: Self-pay | Admitting: Family Medicine

## 2018-10-01 ENCOUNTER — Other Ambulatory Visit: Payer: Self-pay | Admitting: Family Medicine

## 2018-10-07 ENCOUNTER — Other Ambulatory Visit: Payer: Self-pay | Admitting: Family Medicine

## 2018-10-09 ENCOUNTER — Ambulatory Visit (INDEPENDENT_AMBULATORY_CARE_PROVIDER_SITE_OTHER): Payer: 59 | Admitting: Family Medicine

## 2018-10-09 ENCOUNTER — Encounter: Payer: Self-pay | Admitting: Family Medicine

## 2018-10-09 VITALS — BP 124/80 | Ht 63.75 in | Wt 135.6 lb

## 2018-10-09 DIAGNOSIS — F418 Other specified anxiety disorders: Secondary | ICD-10-CM

## 2018-10-09 DIAGNOSIS — Z79899 Other long term (current) drug therapy: Secondary | ICD-10-CM

## 2018-10-09 DIAGNOSIS — Z125 Encounter for screening for malignant neoplasm of prostate: Secondary | ICD-10-CM

## 2018-10-09 DIAGNOSIS — Z0000 Encounter for general adult medical examination without abnormal findings: Secondary | ICD-10-CM

## 2018-10-09 DIAGNOSIS — Z13220 Encounter for screening for lipoid disorders: Secondary | ICD-10-CM

## 2018-10-09 MED ORDER — CLONAZEPAM 1 MG PO TABS: ORAL_TABLET | ORAL | 5 refills | Status: DC

## 2018-10-09 MED ORDER — PANTOPRAZOLE SODIUM 40 MG PO TBEC: 40.0000 mg | DELAYED_RELEASE_TABLET | Freq: Every day | ORAL | 5 refills | Status: DC

## 2018-10-09 MED ORDER — SERTRALINE HCL 50 MG PO TABS: ORAL_TABLET | ORAL | 5 refills | Status: DC

## 2018-10-09 MED ORDER — TAMSULOSIN HCL 0.4 MG PO CAPS: 0.4000 mg | ORAL_CAPSULE | Freq: Every day | ORAL | 5 refills | Status: DC

## 2018-10-09 NOTE — Progress Notes (Signed)
   Subjective:    Patient ID: Thomas Hobbs, male    DOB: 09/08/1976, 42 y.o.   MRN: [id number]  HPI The patient comes in today for a wellness visit.    A review of their health history was completed.  A review of medications was also completed.  Any needed refills; up date all refills  Eating habits: not health conscious  Falls/  MVA accidents in past few months: none  Regular exercise: just work  [employer]'s clerkpecialist pt sees on regular basis: none  Preventative health issues were discussed.   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  No thoughts of suicide at all.  Just occasional what is the use feelings.   Exercising fair amnt at work   Does not execise out side of work  Diet so so  Review of Systems  Constitutional: Negative for activity change, appetite change and fever.  HENT: Negative for congestion and rhinorrhea.   Eyes: Negative for discharge.  Respiratory: Negative for cough and wheezing.   Cardiovascular: Negative for chest pain.  Gastrointestinal: Negative for abdominal pain, blood in stool and vomiting.  Genitourinary: Negative for difficulty urinating and frequency.  Musculoskeletal: Negative for neck pain.  Skin: Negative for rash.  Allergic/Immunologic: Negative for environmental allergies and food allergies.  Neurological: Negative for weakness and headaches.  Psychiatric/Behavioral: Negative for agitation.  All other systems reviewed and are negative.      Objective:   Physical Exam  Constitutional: He appears well-developed and well-nourished.  HENT:  Head: Normocephalic and atraumatic.  Right Ear: External ear normal.  Left Ear: External ear normal.  Nose: Nose normal.  Mouth/Throat: Oropharynx is clear and moist.  Eyes:  Pupils are equal, round, and reactive to light. EOM are normal.  Neck: Normal range of motion. Neck supple. No thyromegaly present.  Cardiovascular: Normal rate, regular rhythm and normal heart sounds.  No murmur heard. Pulmonary/Chest: Effort normal and breath sounds normal. No respiratory distress. He has no wheezes.  Abdominal: Soft. Bowel sounds are normal. He exhibits no distension and no mass. There is no tenderness.  Genitourinary: Penis normal.  Musculoskeletal: Normal range of motion. He exhibits no edema.  Lymphadenopathy:    He has no cervical adenopathy.  Neurological: He is alert. He exhibits normal muscle tone.  Skin: Skin is warm and dry. No erythema.  Psychiatric: He has a normal mood and affect. His behavior is normal. Judgment normal.  Vitals reviewed.         Assessment & Plan:  Impression wellness exam.  Diet discussed.  Exercise discussed.  Concurs.  Appropriate blood work.   depression/anxiety.  Substantial.  Discussed with patient.  Long discussion held.  Having symptoms such as burning mouth and anxiety and even some weight loss.  Patient notes under a lot of stress with his new fiance who has a child in another country and unable to get him currently.  I have strongly encouraged mental health/psychiatric management.  Patient declining at this time.  States that if not suicidal if things get better as his situation improves hopefully with his immigration issues for his fiance.  Appropriate blood work ordered  Medications refilled follow-up in 6 months

## 2018-10-10 DIAGNOSIS — Z0000 Encounter for general adult medical examination without abnormal findings: Secondary | ICD-10-CM | POA: Diagnosis not present

## 2018-10-10 DIAGNOSIS — Z13220 Encounter for screening for lipoid disorders: Secondary | ICD-10-CM | POA: Diagnosis not present

## 2018-10-10 DIAGNOSIS — Z79899 Other long term (current) drug therapy: Secondary | ICD-10-CM | POA: Diagnosis not present

## 2018-10-11 ENCOUNTER — Encounter: Payer: Self-pay | Admitting: Family Medicine

## 2018-10-11 LAB — BASIC METABOLIC PANEL
BUN / CREAT RATIO: 13 (ref 9–20)
BUN: 11 mg/dL (ref 6–24)
CHLORIDE: 103 mmol/L (ref 96–106)
CO2: 24 mmol/L (ref 20–29)
CREATININE: 0.88 mg/dL (ref 0.76–1.27)
Calcium: 9.5 mg/dL (ref 8.7–10.2)
GFR calc Af Amer: 122 mL/min/{1.73_m2} (ref >59)
GFR calc non Af Amer: 105 mL/min/{1.73_m2} (ref >59)
Glucose: 95 mg/dL (ref 65–99)
Potassium: 4.5 mmol/L (ref 3.5–5.2)
SODIUM: 140 mmol/L (ref 134–144)

## 2018-10-11 LAB — PSA: PROSTATE SPECIFIC AG, SERUM: 0.5 ng/mL (ref 0.0–4.0)

## 2018-10-11 LAB — TSH: TSH: 1.37 u[IU]/mL (ref 0.450–4.500)

## 2018-10-11 LAB — HEPATIC FUNCTION PANEL
ALK PHOS: 88 IU/L (ref 39–117)
ALT: 8 IU/L (ref 0–44)
AST: 16 IU/L (ref 0–40)
Albumin: 4.7 g/dL (ref 3.5–5.5)
Bilirubin Total: 0.3 mg/dL (ref 0.0–1.2)
Bilirubin, Direct: 0.09 mg/dL (ref 0.00–0.40)
TOTAL PROTEIN: 6.7 g/dL (ref 6.0–8.5)

## 2018-10-11 LAB — LIPID PANEL
CHOL/HDL RATIO: 4.4 ratio (ref 0.0–5.0)
Cholesterol, Total: 166 mg/dL (ref 100–199)
HDL: 38 mg/dL — AB (ref >39)
LDL Calculated: 110 mg/dL — ABNORMAL HIGH (ref 0–99)
TRIGLYCERIDES: 90 mg/dL (ref 0–149)
VLDL CHOLESTEROL CAL: 18 mg/dL (ref 5–40)

## 2018-11-05 ENCOUNTER — Other Ambulatory Visit: Payer: Self-pay | Admitting: Family Medicine

## 2018-11-30 ENCOUNTER — Telehealth: Payer: Self-pay | Admitting: Family Medicine

## 2018-11-30 NOTE — Telephone Encounter (Signed)
Pharmacy requesting refill on Sildenafil 20 mg tablets. Take 2-3 tablets by mouth 2 hours prior to sex. Please advise. Thank you

## 2018-11-30 NOTE — Telephone Encounter (Signed)
Sure plus 11 ref

## 2018-12-01 MED ORDER — SILDENAFIL CITRATE 20 MG PO TABS: ORAL_TABLET | ORAL | 11 refills | Status: DC

## 2018-12-01 NOTE — Addendum Note (Signed)
Addended by: Metro Kung on: 12/01/2018 08:23 AM   Modules accepted: Orders

## 2018-12-01 NOTE — Telephone Encounter (Signed)
Refills sent to pharm

## 2019-01-15 ENCOUNTER — Encounter: Payer: Self-pay | Admitting: Family Medicine

## 2019-01-15 ENCOUNTER — Ambulatory Visit: Payer: No Typology Code available for payment source | Admitting: Family Medicine

## 2019-01-15 VITALS — BP 118/70 | Temp 98.4°F | Wt 139.4 lb

## 2019-01-15 DIAGNOSIS — R1013 Epigastric pain: Secondary | ICD-10-CM | POA: Diagnosis not present

## 2019-01-15 MED ORDER — SUCRALFATE 1 G PO TABS: 1.0000 g | ORAL_TABLET | Freq: Three times a day (TID) | ORAL | 0 refills | Status: DC

## 2019-01-15 NOTE — Progress Notes (Signed)
   Subjective:    Patient ID: Thomas Hobbs, male    DOB: 06-07-1976, 43 y.o.   MRN: [id number]  Abdominal Pain  This is a new problem. The current episode started more than 1 month ago. The pain is located in the generalized abdominal region. Pertinent negatives include no constipation, diarrhea, fever, nausea or vomiting.    Pt has had stomach trouble for years. Pt was in [place] back in January. Pt states that when he went to [place] last April, Dr.Steve gave him an antibiotic to take and that helped for a little bit but pain did come back. Pt states he can eat but with the stomach pain he does not want to eat.  Came home from [place] 12/19/18, states that is when his abdominal pain started. States he took some leftover cipro a couple weeks ago - took for 6 days. States that helped and his stomach didn't hurt for 4-5 days and then slowly came back. States pain is constant located in epigrastric area but sometimes generalized; sometimes described as sharp pain. No N/V, no fevers. States had diarrhea for 1 or 2 days a few weeks ago, normal BM since. No blood in stool. States appetite is normal, but hesitant to eat because that seems to make pain worse. Drinking plenty of fluids.   Taking protonix daily.   Review of Systems  Constitutional: Negative for appetite change, fever and unexpected weight change.  Gastrointestinal: Positive for abdominal pain. Negative for anal bleeding, blood in stool, constipation, diarrhea, nausea and vomiting.       Objective:   Physical Exam Vitals signs and nursing note reviewed.  Constitutional:      General: He is not in acute distress.    Appearance: Normal appearance. He is not toxic-appearing.  HENT:     Head: Normocephalic and atraumatic.  Cardiovascular:     Rate and Rhythm: Normal rate and regular rhythm.     Heart sounds: Normal heart sounds.  Pulmonary:     Effort: Pulmonary effort is normal. No respiratory distress.     Breath  sounds: Normal breath sounds.  Abdominal:     General: Abdomen is flat. Bowel sounds are normal. There is no distension.     Palpations: Abdomen is soft. There is no mass.     Tenderness: There is generalized abdominal tenderness (Mild tenderness, worse over epigastric area). There is no guarding.  Skin:    General: Skin is warm and dry.  Neurological:     Mental Status: He is alert and oriented to person, place, and time.  Psychiatric:        Behavior: Behavior normal.           Assessment & Plan:  Epigastric pain - Plan: CBC with Differential, Basic Metabolic Panel (BMET), Hepatic function panel, Lipase  Discussed with patient that I do not feel antibiotics are necessary at this time and would likely cause more harm than benefit at this point. Recommend obtaining some basic lab work, will notify patient of results. Do not feel any imaging is necessary at this time. Also recommend carafate tid with meals and at bedtime to hopefully help relieve some of his discomfort. He should f/u in 2 weeks, if symptoms continuing at that time may need referral to GI for endoscopy. Warning signs discussed with patient. F/u sooner if needed.   Dr. Lilyan Punt was consulted on this case and is in agreement with the above treatment plan.

## 2019-01-16 LAB — CBC WITH DIFFERENTIAL/PLATELET
BASOS ABS: 0.1 10*3/uL (ref 0.0–0.2)
Basos: 1 %
EOS (ABSOLUTE): 0.6 10*3/uL — ABNORMAL HIGH (ref 0.0–0.4)
Eos: 7 %
Hematocrit: 46.1 % (ref 37.5–51.0)
Hemoglobin: 15.6 g/dL (ref 13.0–17.7)
Immature Grans (Abs): 0 10*3/uL (ref 0.0–0.1)
Immature Granulocytes: 0 %
LYMPHS ABS: 3 10*3/uL (ref 0.7–3.1)
Lymphs: 35 %
MCH: 30.4 pg (ref 26.6–33.0)
MCHC: 33.8 g/dL (ref 31.5–35.7)
MCV: 90 fL (ref 79–97)
MONOS ABS: 0.6 10*3/uL (ref 0.1–0.9)
Monocytes: 8 %
Neutrophils Absolute: 4.1 10*3/uL (ref 1.4–7.0)
Neutrophils: 49 %
Platelets: 249 10*3/uL (ref 150–450)
RBC: 5.14 x10E6/uL (ref 4.14–5.80)
RDW: 12.9 % (ref 11.6–15.4)
WBC: 8.5 10*3/uL (ref 3.4–10.8)

## 2019-01-16 LAB — HEPATIC FUNCTION PANEL
ALBUMIN: 4.4 g/dL (ref 4.0–5.0)
ALT: 7 IU/L (ref 0–44)
AST: 15 IU/L (ref 0–40)
Alkaline Phosphatase: 80 IU/L (ref 39–117)
Bilirubin Total: <0.2 mg/dL (ref 0.0–1.2)
Bilirubin, Direct: 0.05 mg/dL (ref 0.00–0.40)
TOTAL PROTEIN: 6.7 g/dL (ref 6.0–8.5)

## 2019-01-16 LAB — BASIC METABOLIC PANEL
BUN / CREAT RATIO: 16 (ref 9–20)
BUN: 14 mg/dL (ref 6–24)
CO2: 20 mmol/L (ref 20–29)
Calcium: 9.7 mg/dL (ref 8.7–10.2)
Chloride: 102 mmol/L (ref 96–106)
Creatinine, Ser: 0.85 mg/dL (ref 0.76–1.27)
GFR calc Af Amer: 123 mL/min/{1.73_m2} (ref >59)
GFR calc non Af Amer: 106 mL/min/{1.73_m2} (ref >59)
GLUCOSE: 80 mg/dL (ref 65–99)
POTASSIUM: 4.7 mmol/L (ref 3.5–5.2)
SODIUM: 139 mmol/L (ref 134–144)

## 2019-01-16 LAB — LIPASE: LIPASE: 46 U/L (ref 13–78)

## 2019-02-04 ENCOUNTER — Other Ambulatory Visit: Payer: Self-pay | Admitting: *Deleted

## 2019-02-04 MED ORDER — CLONAZEPAM 1 MG PO TABS: ORAL_TABLET | ORAL | 2 refills | Status: DC

## 2019-02-04 MED ORDER — PANTOPRAZOLE SODIUM 40 MG PO TBEC: 40.0000 mg | DELAYED_RELEASE_TABLET | Freq: Every day | ORAL | 0 refills | Status: DC

## 2019-02-04 MED ORDER — SERTRALINE HCL 50 MG PO TABS: ORAL_TABLET | ORAL | 0 refills | Status: DC

## 2019-02-04 NOTE — Telephone Encounter (Signed)
3 months on all of these please order and pend or order and fax nerve medicine

## 2019-02-11 ENCOUNTER — Other Ambulatory Visit: Payer: Self-pay | Admitting: Family Medicine

## 2019-05-18 ENCOUNTER — Telehealth: Payer: Self-pay | Admitting: Family Medicine

## 2019-05-18 MED ORDER — PANTOPRAZOLE SODIUM 40 MG PO TBEC: 40.0000 mg | DELAYED_RELEASE_TABLET | Freq: Every day | ORAL | 1 refills | Status: DC

## 2019-05-18 MED ORDER — SERTRALINE HCL 50 MG PO TABS: ORAL_TABLET | ORAL | 1 refills | Status: DC

## 2019-05-18 NOTE — Telephone Encounter (Signed)
Six mo ok 

## 2019-05-18 NOTE — Telephone Encounter (Signed)
Medication refills sent in to pharmacy 

## 2019-05-18 NOTE — Telephone Encounter (Signed)
Pharmacy requesting refill on Pantoprazole 40 mg and Sertraline 50 mg tablets. Pt last seen 01/15/2019 for epigastric pain. Please advise. Thank you

## 2019-05-18 NOTE — Addendum Note (Signed)
Addended by: Vicente Males on: 05/18/2019 04:00 PM   Modules accepted: Orders

## 2019-07-23 ENCOUNTER — Telehealth: Payer: Self-pay | Admitting: *Deleted

## 2019-07-27 ENCOUNTER — Telehealth: Payer: Self-pay | Admitting: *Deleted

## 2019-07-27 MED ORDER — CLONAZEPAM 1 MG PO TABS: ORAL_TABLET | ORAL | 1 refills | Status: DC

## 2019-07-27 NOTE — Telephone Encounter (Signed)
Refill the numb 36 with one ref

## 2019-07-27 NOTE — Addendum Note (Signed)
Addended by: Carmelina Noun on: 07/27/2019 04:43 PM   Modules accepted: Orders

## 2019-07-27 NOTE — Telephone Encounter (Signed)
Faxed to pharm 

## 2019-07-27 NOTE — Telephone Encounter (Signed)
[facility] requesting a 90 day supply of clonazepam. Wellness visit was  10/09/18 and last seen in February for acute issue

## 2019-07-29 NOTE — Telephone Encounter (Signed)
error 

## 2019-07-30 ENCOUNTER — Telehealth: Payer: Self-pay | Admitting: Family Medicine

## 2019-07-30 ENCOUNTER — Other Ambulatory Visit: Payer: Self-pay | Admitting: *Deleted

## 2019-07-30 MED ORDER — CLONAZEPAM 1 MG PO TABS: ORAL_TABLET | ORAL | 0 refills | Status: DC

## 2019-07-30 NOTE — Telephone Encounter (Signed)
Script printed await signature then will fax and call pt.  

## 2019-07-30 NOTE — Telephone Encounter (Signed)
We received [facility] for this med on 9/8. #36 with one additional was approved and [facility]. Pt states he will run out of med he has one pill left and wants a script sent to local pharm.

## 2019-07-30 NOTE — Telephone Encounter (Signed)
Patient states he called [facility] and they have no prescription for him can you resend in Klonopin 1 mg to them . He states has one pill left. Also send in the [facility].

## 2019-07-30 NOTE — Telephone Encounter (Signed)
ok 

## 2019-07-30 NOTE — Telephone Encounter (Signed)
Faxed and pt was notified.  

## 2019-10-18 ENCOUNTER — Other Ambulatory Visit: Payer: Self-pay | Admitting: Family Medicine

## 2019-10-18 NOTE — Telephone Encounter (Signed)
One mo ok, needs virt f u

## 2019-10-28 ENCOUNTER — Other Ambulatory Visit: Payer: Self-pay | Admitting: Family Medicine

## 2019-11-29 ENCOUNTER — Other Ambulatory Visit: Payer: Self-pay | Admitting: Family Medicine

## 2019-11-29 NOTE — Telephone Encounter (Signed)
May ref times one, needs o v

## 2019-11-30 NOTE — Telephone Encounter (Signed)
Please schedule and then route back to nurses to send in refills 

## 2019-12-02 NOTE — Telephone Encounter (Signed)
Scheduled 1/19

## 2019-12-07 ENCOUNTER — Encounter: Payer: Self-pay | Admitting: Family Medicine

## 2019-12-07 ENCOUNTER — Other Ambulatory Visit: Payer: Self-pay

## 2019-12-07 ENCOUNTER — Ambulatory Visit (INDEPENDENT_AMBULATORY_CARE_PROVIDER_SITE_OTHER): Payer: No Typology Code available for payment source | Admitting: Family Medicine

## 2019-12-07 DIAGNOSIS — F418 Other specified anxiety disorders: Secondary | ICD-10-CM

## 2019-12-07 MED ORDER — CLONAZEPAM 1 MG PO TABS: ORAL_TABLET | ORAL | 0 refills | Status: DC

## 2019-12-07 MED ORDER — CLONAZEPAM 1 MG PO TABS: ORAL_TABLET | ORAL | 5 refills | Status: DC

## 2019-12-07 NOTE — Progress Notes (Signed)
   Subjective:  Audio video  Patient ID: Thomas Hobbs, male    DOB: 10-22-1976, 44 y.o.   MRN: [id number]  HPI Pt is needing refills on Clonazepam 1 mg. One script sent to [facility] in [place] and one sent to mail order. Pt states he has been out for a day or so. Pt is trying to come off of the Zoloft 50 mg. Pt has not taken Zoloft in 3 days.  Virtual Visit via Video Note  I connected with Dorann Ou on 12/07/19 at  8:30 AM EST by a video enabled telemedicine application and verified that I am speaking with the correct person using two identifiers.  Location: Patient: home Provider: office   I discussed the limitations of evaluation and management by telemedicine and the availability of in person appointments. The patient expressed understanding and agreed to proceed.  History of Present Illness:    Observations/Objective:   Assessment and Plan:   Follow Up Instructions:    I discussed the assessment and treatment plan with the patient. The patient was provided an opportunity to ask questions and all were answered. The patient agreed with the plan and demonstrated an understanding of the instructions.   The patient was advised to call back or seek an in-person evaluation if the symptoms worsen or if the condition fails to improve as anticipated.  I provided 20 minutes of non-face-to-face time during this encounter.  Patient has a longstanding history of anxiety.  Also longstanding element of depression.  Has tried to get off Zoloft in the past without success.  He attributes this completely to symptoms that he gets when he withdraws even slowly.  Trying once again.  Still has a lot of anxiety.  A lot of stress.  No suicidal thoughts.  Definitely needs his benzodiazepines  Getting some exercise    Review of Systems No headache no chest pain no shortness of breath    Objective:   Physical Exam   Virtual     Assessment & Plan:  Impression generalized  anxiety disorder/depression plan patient to continue to try to wean off Zoloft.  Benzodiazepines refilled.  Diet exercise discussed.  If needs to maintain Zoloft will call back

## 2019-12-20 ENCOUNTER — Encounter: Payer: Self-pay | Admitting: Family Medicine

## 2020-04-24 ENCOUNTER — Encounter: Payer: Self-pay | Admitting: Family Medicine

## 2020-04-24 ENCOUNTER — Other Ambulatory Visit: Payer: Self-pay

## 2020-04-24 ENCOUNTER — Ambulatory Visit (INDEPENDENT_AMBULATORY_CARE_PROVIDER_SITE_OTHER): Payer: No Typology Code available for payment source | Admitting: Family Medicine

## 2020-04-24 VITALS — BP 128/80 | HR 88 | Temp 98.1°F | Ht 64.5 in | Wt 146.0 lb

## 2020-04-24 DIAGNOSIS — Z0000 Encounter for general adult medical examination without abnormal findings: Secondary | ICD-10-CM

## 2020-04-24 DIAGNOSIS — F418 Other specified anxiety disorders: Secondary | ICD-10-CM

## 2020-04-24 DIAGNOSIS — Z79899 Other long term (current) drug therapy: Secondary | ICD-10-CM
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
## 2020-04-25 LAB — MED LIST ATTACHED SEPARATELY

## 2020-04-28 LAB — SPECIMEN STATUS REPORT

## 2020-04-28 LAB — TOXASSURE SELECT 13 (MW), URINE

## 2020-05-01 ENCOUNTER — Encounter: Payer: Self-pay | Admitting: Family Medicine

## 2020-05-13 LAB — CMP14+EGFR
ALT: 8 IU/L (ref 0–44)
AST: 15 IU/L (ref 0–40)
Albumin/Globulin Ratio: 2.2 (ref 1.2–2.2)
Albumin: 4.7 g/dL (ref 4.0–5.0)
Alkaline Phosphatase: 93 IU/L (ref 48–121)
BUN/Creatinine Ratio: 15 (ref 9–20)
BUN: 14 mg/dL (ref 6–24)
Bilirubin Total: 0.4 mg/dL (ref 0.0–1.2)
CO2: 24 mmol/L (ref 20–29)
Calcium: 9.2 mg/dL (ref 8.7–10.2)
Chloride: 103 mmol/L (ref 96–106)
Creatinine, Ser: 0.91 mg/dL (ref 0.76–1.27)
GFR calc Af Amer: 117 mL/min/{1.73_m2} (ref >59)
GFR calc non Af Amer: 101 mL/min/{1.73_m2} (ref >59)
Globulin, Total: 2.1 g/dL (ref 1.5–4.5)
Glucose: 85 mg/dL (ref 65–99)
Potassium: 4.4 mmol/L (ref 3.5–5.2)
Sodium: 138 mmol/L (ref 134–144)
Total Protein: 6.8 g/dL (ref 6.0–8.5)

## 2020-05-13 LAB — CBC
Hematocrit: 48.1 % (ref 37.5–51.0)
Hemoglobin: 15.7 g/dL (ref 13.0–17.7)
MCH: 30.2 pg (ref 26.6–33.0)
MCHC: 32.6 g/dL (ref 31.5–35.7)
MCV: 93 fL (ref 79–97)
Platelets: 249 10*3/uL (ref 150–450)
RBC: 5.2 x10E6/uL (ref 4.14–5.80)
RDW: 13 % (ref 11.6–15.4)
WBC: 6.9 10*3/uL (ref 3.4–10.8)

## 2020-05-13 LAB — LIPID PANEL
Chol/HDL Ratio: 7 ratio — ABNORMAL HIGH (ref 0.0–5.0)
Cholesterol, Total: 182 mg/dL (ref 100–199)
HDL: 26 mg/dL — ABNORMAL LOW (ref >39)
LDL Chol Calc (NIH): 128 mg/dL — ABNORMAL HIGH (ref 0–99)
Triglycerides: 156 mg/dL — ABNORMAL HIGH (ref 0–149)
VLDL Cholesterol Cal: 28 mg/dL (ref 5–40)

## 2020-05-13 LAB — TSH: TSH: 1.97 u[IU]/mL (ref 0.450–4.500)

## 2020-06-15 ENCOUNTER — Ambulatory Visit (HOSPITAL_COMMUNITY)
Admission: RE | Admit: 2020-06-15 | Discharge: 2020-06-15 | Disposition: A | Discharge status: Home / Self Care | Payer: 59 | Source: Ambulatory Visit | Attending: Family Medicine | Admitting: Family Medicine

## 2020-06-15 ENCOUNTER — Other Ambulatory Visit: Payer: Self-pay

## 2020-06-15 ENCOUNTER — Encounter: Payer: Self-pay | Admitting: Family Medicine

## 2020-06-15 ENCOUNTER — Ambulatory Visit: Payer: No Typology Code available for payment source | Admitting: Family Medicine

## 2020-06-15 VITALS — BP 118/76 | HR 94 | Temp 97.0°F | Ht 64.5 in | Wt 142.2 lb

## 2020-06-15 DIAGNOSIS — K5900 Constipation, unspecified: Secondary | ICD-10-CM | POA: Diagnosis present

## 2020-06-15 DIAGNOSIS — R1030 Lower abdominal pain, unspecified: Secondary | ICD-10-CM

## 2020-06-15 NOTE — Progress Notes (Signed)
Patient ID: Thomas Hobbs, male    DOB: [birth year], 44 y.o.   MRN: [id number]   Chief Complaint  Patient presents with  . Blood In Stools   Subjective:    HPI Pt has been having stomach issues for 1 wk. Feeling nauseated. Constipated over the weekend. Feeling like he has to go to bathroom multiple times and sometimes he would have a movement and sometimes not. Pt states that BM has been covered in mucus and some times with bright red blood in the stool. Black in color on Monday. Some times does have strong odor. If pt lays down and naps, he is fine for about 30-60 mins afterwards. Pt has had appetite changes also.    Solid stools, no diarrhea. No fever or vomiting. Has one 81mg  asa daily.  No other nsaids. Constant lower abd pain and dec appetite of just 2 meals per day. No pain with eating.  Pt has h/o gerd and taking protonix 40mg  daily. Labs reviewed from last visit on 05/12/20.  Medical History Thomas Hobbs has a past medical history of Depression, GERD (gastroesophageal reflux disease), and Hydrocele.   Outpatient Encounter Medications as of 06/15/2020  Medication Sig  . clonazePAM (KLONOPIN) 1 MG tablet TAKE 1 TABLET UP TO TWICE A DAY AS NEEDED FOR ANXIETY. MUST LAST ONE MONTH  . pantoprazole (PROTONIX) 40 MG tablet TAKE 1 TABLET DAILY  . [DISCONTINUED] CLINDAMYCIN HCL PO Take by mouth.   No facility-administered encounter medications on file as of 06/15/2020.     Review of Systems  Constitutional: Positive for appetite change. Negative for chills and fever.  HENT: Negative for congestion, rhinorrhea and sore throat.   Respiratory: Negative for cough, shortness of breath and wheezing.   Cardiovascular: Negative for chest pain and leg swelling.  Gastrointestinal: Positive for abdominal pain (mid lower abdomen), blood in stool, constipation (resolved) and nausea. Negative for diarrhea and vomiting.  Genitourinary: Negative for dysuria and frequency.  Skin: Negative for rash.   Neurological: Negative for dizziness, weakness and headaches.    Vitals BP 118/76   Pulse 94   Temp (!) 97 F (36.1 C)   Ht 5' 4.5" (1.638 m)   Wt 142 lb 3.2 oz (64.5 kg)   SpO2 98%   BMI 24.03 kg/m   Objective:   Physical Exam Vitals and nursing note reviewed.  Constitutional:      General: He is not in acute distress.    Appearance: Normal appearance. He is not ill-appearing.  HENT:     Head: Normocephalic.  Eyes:     Extraocular Movements: Extraocular movements intact.     Conjunctiva/sclera: Conjunctivae normal.     Pupils: Pupils are equal, round, and reactive to light.  Cardiovascular:     Rate and Rhythm: Normal rate and regular rhythm.     Pulses: Normal pulses.     Heart sounds: Normal heart sounds. No murmur heard.   Pulmonary:     Effort: Pulmonary effort is normal.     Breath sounds: Normal breath sounds. No wheezing, rhonchi or rales.  Abdominal:     General: Abdomen is flat. There is no distension.     Palpations: Abdomen is soft. There is no mass.     Tenderness: There is no abdominal tenderness. There is no guarding or rebound.     Hernia: No hernia is present.  Musculoskeletal:        General: Normal range of motion.     Right lower leg:  No edema.     Left lower leg: No edema.  Skin:    General: Skin is warm and dry.     Findings: No rash.  Neurological:     General: No focal deficit present.     Mental Status: He is alert and oriented to person, place, and time.     Cranial Nerves: No cranial nerve deficit.  Psychiatric:        Mood and Affect: Mood normal.        Behavior: Behavior normal.        Thought Content: Thought content normal.        Judgment: Judgment normal.     Assessment and Plan   1. Lower abdominal pain - DG Abd 1 View; Future  2. Constipation, unspecified constipation type - DG Abd 1 View; Future   Ordered abd xray. Tylenol prn for pain. Stop asa and nsaids.  Ordered GI consult.    Call or rto if worsening  pain, fever, vomiting, or more blood in stool. F/u prn.

## 2020-06-20 ENCOUNTER — Other Ambulatory Visit: Payer: Self-pay | Admitting: Family Medicine

## 2020-06-20 ENCOUNTER — Telehealth: Payer: Self-pay | Admitting: Family Medicine

## 2020-06-20 MED ORDER — CLONAZEPAM 1 MG PO TABS: ORAL_TABLET | ORAL | 2 refills | Status: DC

## 2020-06-20 NOTE — Telephone Encounter (Signed)
Medication was sent in   

## 2020-06-20 NOTE — Telephone Encounter (Signed)
[facility] requesting refill on Clonazepam 1 mg tablet. Take one tablet po up to BID prn. Pt last seen 06/15/20 for abdominal pain. Please advise. Thank you

## 2020-08-16 IMAGING — DX DG ABDOMEN 1V
1 series · 1 of 1 positions shown · non-contrast
Comparison: None.

CLINICAL DATA: Constipation

EXAM:
ABDOMEN - 1 VIEW

[abdomen kub]
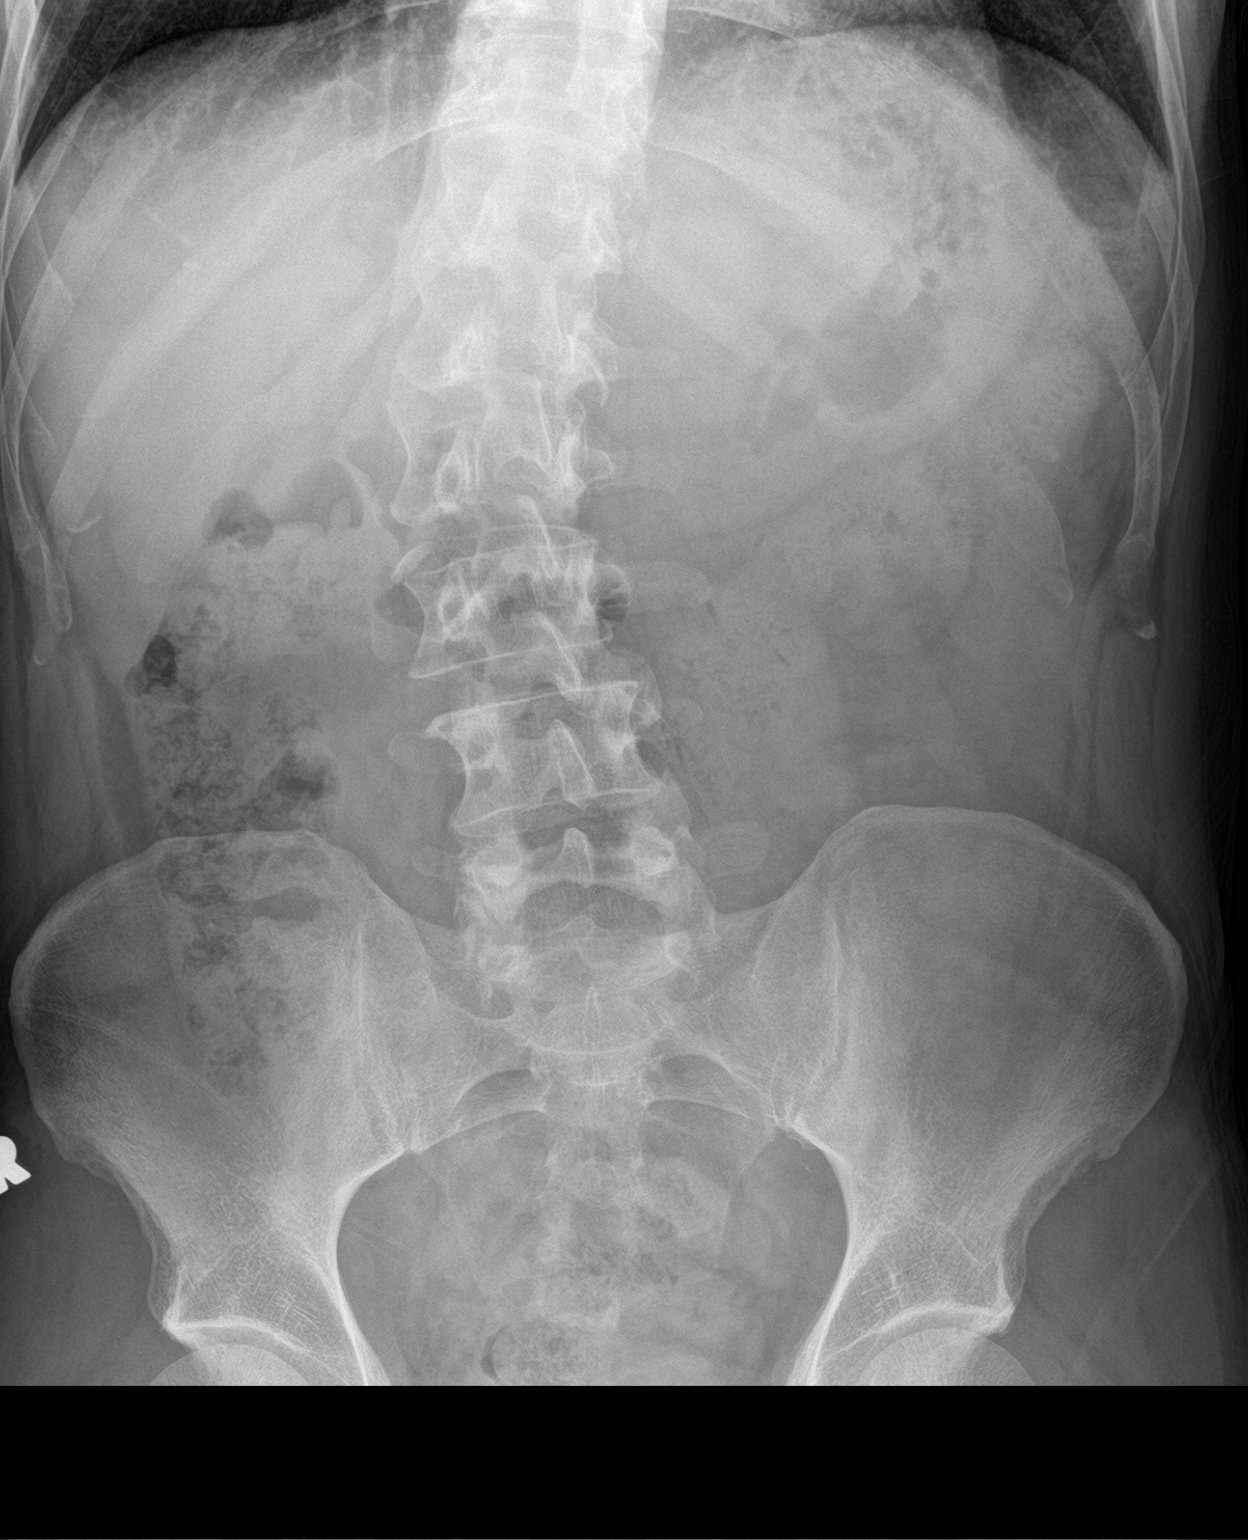

[1 of 1 positions shown; findings below may reference images not displayed]

FINDINGS: Air and stool-filled nondilated loops of bowel. Moderate colonic
stool burden diffusely throughout the colon. Pelvic phleboliths.
Visualized lung bases are unremarkable. Dextrocurvature of the
lumbar spine.
IMPRESSION: Nonobstructive bowel gas pattern. Moderate colonic stool burden
diffusely throughout the colon.

## 2020-10-11 ENCOUNTER — Telehealth: Payer: Self-pay | Admitting: *Deleted

## 2020-10-11 MED ORDER — CLONAZEPAM 1 MG PO TABS: ORAL_TABLET | ORAL | 0 refills | Status: DC

## 2020-10-11 NOTE — Telephone Encounter (Signed)
Needs appt for f/u on anxiety meds. In next 30 days. thx dr. Ladona Ridgel

## 2020-10-17 NOTE — Telephone Encounter (Signed)
Schedule appointment on 12/9

## 2020-10-18 ENCOUNTER — Other Ambulatory Visit: Payer: Self-pay | Admitting: *Deleted

## 2020-10-18 MED ORDER — PANTOPRAZOLE SODIUM 40 MG PO TBEC: 40.0000 mg | DELAYED_RELEASE_TABLET | Freq: Every day | ORAL | 0 refills | Status: DC

## 2020-10-26 ENCOUNTER — Encounter: Payer: Self-pay | Admitting: Family Medicine

## 2020-10-26 ENCOUNTER — Ambulatory Visit (INDEPENDENT_AMBULATORY_CARE_PROVIDER_SITE_OTHER): Payer: No Typology Code available for payment source | Admitting: Family Medicine

## 2020-10-26 ENCOUNTER — Other Ambulatory Visit: Payer: Self-pay

## 2020-10-26 VITALS — BP 112/72 | HR 89 | Temp 97.9°F | Ht 64.5 in | Wt 141.0 lb

## 2020-10-26 DIAGNOSIS — F418 Other specified anxiety disorders: Secondary | ICD-10-CM

## 2020-10-26 DIAGNOSIS — K2100 Gastro-esophageal reflux disease with esophagitis, without bleeding: Secondary | ICD-10-CM | POA: Diagnosis not present

## 2020-10-26 MED ORDER — PANTOPRAZOLE SODIUM 40 MG PO TBEC: 40.0000 mg | DELAYED_RELEASE_TABLET | Freq: Every day | ORAL | 1 refills | Status: DC

## 2020-10-26 MED ORDER — CLONAZEPAM 1 MG PO TABS: 1.0000 mg | ORAL_TABLET | Freq: Two times a day (BID) | ORAL | 2 refills | Status: DC | PRN

## 2020-10-26 NOTE — Progress Notes (Signed)
Patient ID: Thomas Hobbs, male    DOB: [birth year], 44 y.o.   MRN: [id number]   Chief Complaint  Patient presents with  . Anxiety    Follow up   Subjective:    HPI Pt seen today for f/u anxiety. Pt feeling well healthy.  Still having a lot of anxiety.  On PHQ9- has 19 score. GAD 7 - is elevated also. Pt was on zoloft for a long time.  Pt not noticing a difference. Having withdrawal when trying to come off the meds.  But when taking zoloft, no side effects, just didn't feel a difference when on it. In 2019- wellbutrin 150mg  and was discontinued due to "side effects."  Not worsening, pt stating feels the same.  Not having SI.  Originally reason for needing medications- Fiance was out of country, and now is here now. Now still having depression and anxiety. His mom has anxiety and sister. 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   Medical History [place] has a past medical history of Depression, GERD (gastroesophageal reflux disease), and Hydrocele.   Outpatient Encounter Medications as of 10/26/2020  Medication Sig  . clonazePAM (KLONOPIN) 1 MG tablet Take 1 tablet (1 mg total) by mouth 2 (two) times daily as needed for anxiety. Take 1 tab p.o. bid prn anxiety.  . pantoprazole (PROTONIX) 40 MG tablet Take 1 tablet (40 mg total) by mouth daily.  . [DISCONTINUED] clonazePAM (KLONOPIN) 1 MG tablet Take 1 tab p.o. bid prn anxiety. NEEDS APPT for more REFILLS.  . [DISCONTINUED] pantoprazole (PROTONIX) 40 MG tablet Take 1 tablet (40 mg total) by mouth daily.   No facility-administered encounter medications on file as of 10/26/2020.     Review of Systems  Constitutional: Negative for chills and fever.  HENT: Negative for congestion, rhinorrhea and sore throat.   Respiratory: Negative for cough, shortness of breath and wheezing.   Cardiovascular: Negative for chest pain and leg swelling.  Gastrointestinal: Negative for abdominal pain, diarrhea,  nausea and vomiting.  Genitourinary: Negative for dysuria and frequency.  Skin: Negative for rash.  Neurological: Negative for dizziness, weakness and headaches.  Psychiatric/Behavioral: Positive for dysphoric mood. Negative for agitation, decreased concentration, hallucinations, self-injury, sleep disturbance and suicidal ideas. The patient is nervous/anxious.      Vitals BP 112/72   Pulse 89   Temp 97.9 F (36.6 C) (Oral)   Ht 5' 4.5" (1.638 m)   Wt 141 lb (64 kg)   SpO2 97%   BMI 23.83 kg/m   Objective:   Physical Exam Vitals and nursing note reviewed.  Constitutional:      General: He is not in acute distress.    Appearance: Normal appearance. He is not ill-appearing.  HENT:     Head: Normocephalic.     Nose: Nose normal. No congestion.     Mouth/Throat:     Mouth: Mucous membranes are moist.     Pharynx: No oropharyngeal exudate.  Eyes:     Extraocular Movements: Extraocular movements intact.     Conjunctiva/sclera: Conjunctivae normal.     Pupils: Pupils are equal, round, and reactive to light.  Cardiovascular:     Rate and Rhythm: Normal rate and regular rhythm.     Pulses: Normal pulses.     Heart sounds: Normal heart sounds. No murmur heard.   Pulmonary:     Effort: Pulmonary effort is normal.     Breath sounds: Normal breath sounds. No wheezing,  rhonchi or rales.  Musculoskeletal:        General: Normal range of motion.     Right lower leg: No edema.     Left lower leg: No edema.  Skin:    General: Skin is warm and dry.     Findings: No rash.  Neurological:     General: No focal deficit present.     Mental Status: He is alert and oriented to person, place, and time.     Cranial Nerves: No cranial nerve deficit.  Psychiatric:        Behavior: Behavior normal.        Thought Content: Thought content normal.        Judgment: Judgment normal.     Comments: +flat affect and depressed mood      Assessment and Plan   1. Depression with anxiety -  clonazePAM (KLONOPIN) 1 MG tablet; Take 1 tablet (1 mg total) by mouth 2 (two) times daily as needed for anxiety. Take 1 tab p.o. bid prn anxiety.  Dispense: 36 tablet; Refill: 2  2. Gastroesophageal reflux disease with esophagitis without hemorrhage - pantoprazole (PROTONIX) 40 MG tablet; Take 1 tablet (40 mg total) by mouth daily.  Dispense: 90 tablet; Refill: 1    Pt has high scoring on PHQ9 and GAD7.  Pt declining counseling or taking another medication such as SSRI or wellbutrin.  Pt stating tried in past, didn't like side effects of wellbutrin.  And felt hard to get off SSRI.  However didn't have side effect while taking ssri.  Just didn't like the idea of having to take a medication that "affects his brain."  Pt denies SI/ or HI.  Can have video visit for follow up on next visit.  Reviewed need to f/u q5mo for controlled medications.  Pt voiced understanding. Advising to call back if feeling worse or wanting to referral to counseling.  Increase in exercising and look into taking up a hobby.    F/u 3 mo or prn.

## 2021-01-24 ENCOUNTER — Telehealth (INDEPENDENT_AMBULATORY_CARE_PROVIDER_SITE_OTHER): Payer: No Typology Code available for payment source | Admitting: Family Medicine

## 2021-01-24 ENCOUNTER — Other Ambulatory Visit: Payer: Self-pay

## 2021-01-24 DIAGNOSIS — F418 Other specified anxiety disorders: Secondary | ICD-10-CM

## 2021-01-24 MED ORDER — CLONAZEPAM 1 MG PO TABS: 1.0000 mg | ORAL_TABLET | Freq: Two times a day (BID) | ORAL | 2 refills | Status: DC | PRN

## 2021-01-24 NOTE — Progress Notes (Signed)
Patient ID: Thomas Hobbs, male    DOB: 09/13/1976, 45 y.o.   MRN: [id number]   Virtual Visit via Video Note  I connected with Thomas Hobbs on 01/24/21 at 10:00 AM EST by a video enabled telemedicine application and verified that I am speaking with the correct person using two identifiers.  Location: Patient: home Provider: office   I discussed the limitations of evaluation and management by telemedicine and the availability of in person appointments. The patient expressed understanding and agreed to proceed.   Chief Complaint  Patient presents with  . Anxiety   Subjective:    HPI  follow up on anxiety.  Gad 7 done.   Pt still feeling down, tired, depressed.  Doesn't want to take any SSRI or try other medications for his anxiety and depression.  Taking clonazepam prn. Feels that he weaned himself off those medications and doesn't want to go back on them.  Didn't like how they made him feel.  Doesn't want something that alters him. Was on zoloft from 2014-2020. Tried wellbutrin a few times.  Pt doesn't remember why he stopped the wellbutrin.    Medical History Thomas Hobbs has a past medical history of Depression, GERD (gastroesophageal reflux disease), and Hydrocele.   Outpatient Encounter Medications as of 01/24/2021  Medication Sig  . pantoprazole (PROTONIX) 40 MG tablet Take 1 tablet (40 mg total) by mouth daily.  . [DISCONTINUED] clonazePAM (KLONOPIN) 1 MG tablet Take 1 tablet (1 mg total) by mouth 2 (two) times daily as needed for anxiety. Take 1 tab p.o. bid prn anxiety.  . clonazePAM (KLONOPIN) 1 MG tablet Take 1 tablet (1 mg total) by mouth 2 (two) times daily as needed for anxiety. Take 1 tab p.o. bid prn anxiety.   No facility-administered encounter medications on file as of 01/24/2021.     Review of Systems  Constitutional: Negative for chills and fever.  HENT: Negative for congestion, rhinorrhea and sore throat.   Respiratory: Negative for cough, shortness  of breath and wheezing.   Cardiovascular: Negative for chest pain and leg swelling.  Gastrointestinal: Negative for abdominal pain, diarrhea, nausea and vomiting.  Genitourinary: Negative for dysuria and frequency.  Skin: Negative for rash.  Neurological: Negative for dizziness, weakness and headaches.  Psychiatric/Behavioral: Positive for agitation and dysphoric mood. Negative for sleep disturbance and suicidal ideas. The patient is nervous/anxious.      Vitals There were no vitals taken for this visit.  Objective:   Physical Exam  No PE due to phone visit.  Assessment and Plan   1. Depression with anxiety - clonazePAM (KLONOPIN) 1 MG tablet; Take 1 tablet (1 mg total) by mouth 2 (two) times daily as needed for anxiety. Take 1 tab p.o. bid prn anxiety.  Dispense: 36 tablet; Refill: 2   Depression with anxiety- stable, suboptimal. Pt not wanting to see counselor or restart a medication for depression. Pt wanting to just stay on the clonazepam.   Return in about 3 months (around 04/26/2021) for f/u anxiety/depression.    Follow Up Instructions:    I discussed the assessment and treatment plan with the patient. The patient was provided an opportunity to ask questions and all were answered. The patient agreed with the plan and demonstrated an understanding of the instructions.   The patient was advised to call back or seek an in-person evaluation if the symptoms worsen or if the condition fails to improve as anticipated.  I provided 12 minutes of non-face-to-face time during this  encounter.

## 2021-03-06 ENCOUNTER — Other Ambulatory Visit: Payer: Self-pay | Admitting: Family Medicine

## 2021-03-06 DIAGNOSIS — F418 Other specified anxiety disorders: Secondary | ICD-10-CM

## 2021-03-13 ENCOUNTER — Other Ambulatory Visit: Payer: Self-pay | Admitting: Family Medicine

## 2021-03-13 DIAGNOSIS — F418 Other specified anxiety disorders: Secondary | ICD-10-CM

## 2021-05-15 ENCOUNTER — Other Ambulatory Visit: Payer: Self-pay

## 2021-05-15 ENCOUNTER — Ambulatory Visit (INDEPENDENT_AMBULATORY_CARE_PROVIDER_SITE_OTHER): Payer: No Typology Code available for payment source | Admitting: Family Medicine

## 2021-05-15 VITALS — BP 123/79 | HR 61 | Temp 98.4°F | Ht 64.5 in | Wt 134.0 lb

## 2021-05-15 DIAGNOSIS — F418 Other specified anxiety disorders: Secondary | ICD-10-CM | POA: Diagnosis not present

## 2021-05-15 DIAGNOSIS — Z125 Encounter for screening for malignant neoplasm of prostate: Secondary | ICD-10-CM

## 2021-05-15 DIAGNOSIS — Z79899 Other long term (current) drug therapy: Secondary | ICD-10-CM

## 2021-05-15 DIAGNOSIS — K2100 Gastro-esophageal reflux disease with esophagitis, without bleeding: Secondary | ICD-10-CM

## 2021-05-15 LAB — MED LIST ATTACHED SEPARATELY

## 2021-05-15 MED ORDER — ESCITALOPRAM OXALATE 10 MG PO TABS: 10.0000 mg | ORAL_TABLET | Freq: Every day | ORAL | 0 refills | Status: DC

## 2021-05-15 MED ORDER — TAMSULOSIN HCL 0.4 MG PO CAPS: 0.4000 mg | ORAL_CAPSULE | Freq: Every day | ORAL | 3 refills | Status: DC

## 2021-05-15 NOTE — Progress Notes (Signed)
Patient ID: Thomas Hobbs, male    DOB: [birth year], 45 y.o.   MRN: [id number]   Chief Complaint  Patient presents with   Anxiety   Subjective:    HPI Follow up on anxiety and depression. Would like to try med for depression and change klonopin to something that has less side effects.   Needs refill on protonix  Not having appetite,  Depressed , not wanting to get up and out. Working in [employer].  Not any desire.  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  Waking up a lot due to urine issues. Getting up 3-5 x per night. Not drinking a lot at night. Drinking mountain dew all day long. Dec stream.  Klonapin taking 1-2 per day, then dec to 1/2, then back up to 1-2 per day. A long time ago was told to take it 4x per day. Afternoon and at bedtime- when has more anxiety.  Medical History [place] has a past medical history of Depression, GERD (gastroesophageal reflux disease), and Hydrocele.   Outpatient Encounter Medications as of 05/15/2021  Medication Sig   escitalopram (LEXAPRO) 10 MG tablet Take 1 tablet (10 mg total) by mouth daily.   pantoprazole (PROTONIX) 40 MG tablet Take 1 tablet (40 mg total) by mouth daily.   tamsulosin (FLOMAX) 0.4 MG CAPS capsule Take 1 capsule (0.4 mg total) by mouth daily.   [DISCONTINUED] clonazePAM (KLONOPIN) 1 MG tablet TAKE 1 TABLET BY MOUTH 2 TIMES DAILY AS NEEDED FOR ANXIETY. MUST LAST 30 DAYS   clonazePAM (KLONOPIN) 1 MG tablet Take 1 tablet (1 mg total) by mouth 2 (two) times daily as needed for anxiety.   No facility-administered encounter medications on file as of 05/15/2021.     Review of Systems  Constitutional:  Negative for chills and fever.  HENT:  Negative for congestion, rhinorrhea and sore throat.   Respiratory:  Negative for cough, shortness of breath and wheezing.   Cardiovascular:  Negative for chest pain and leg swelling.  Gastrointestinal:  Negative for abdominal pain, diarrhea, nausea and vomiting.   Genitourinary:  Negative for dysuria and frequency.  Skin:  Negative for rash.  Neurological:  Negative for dizziness, weakness and headaches.  Psychiatric/Behavioral:  Positive for dysphoric mood. Negative for self-injury, sleep disturbance and suicidal ideas. The patient is nervous/anxious.     Vitals BP 123/79   Pulse 61   Temp 98.4 F (36.9 C)   Ht 5' 4.5" (1.638 m)   Wt 134 lb (60.8 kg)   SpO2 99%   BMI 22.65 kg/m   Objective:   Physical Exam Vitals and nursing note reviewed.  Constitutional:      General: He is not in acute distress.    Appearance: Normal appearance. He is not ill-appearing.  Cardiovascular:     Rate and Rhythm: Normal rate and regular rhythm.     Pulses: Normal pulses.     Heart sounds: Normal heart sounds.  Pulmonary:     Effort: Pulmonary effort is normal. No respiratory distress.     Breath sounds: Normal breath sounds.  Musculoskeletal:        General: Normal range of motion.  Skin:    General: Skin is warm and dry.     Findings: No rash.  Neurological:     General: No focal deficit present.     Mental Status: He is alert and oriented to person, place, and time.  Psychiatric:        Mood and  Affect: Mood normal.        Behavior: Behavior normal.        Thought Content: Thought content normal.        Judgment: Judgment normal.     Assessment and Plan   1. Depression with anxiety - clonazePAM (KLONOPIN) 1 MG tablet; Take 1 tablet (1 mg total) by mouth 2 (two) times daily as needed for anxiety.  Dispense: 30 tablet; Refill: 2  2. Gastroesophageal reflux disease with esophagitis without hemorrhage - CMP14+EGFR  3. Screening PSA (prostate specific antigen) - PSA; Future  4. High risk medication use - ToxASSURE Select 13 (MW), Urine   Gerd- stable.  Cont meds.  Anxiety/depression-chronic on-going, pt in past hesitant to going back on  SSRI.  Pt wanting to cont with clonazepam.  Pt did admit to using detla-8 that may have had thc  in it.  Pt to to urine drug screen today.  Will address if urine comes back abnormal.  Reviewed with pt not to use illegal substances while on klonaopin and that we would have to discontinue prescribing if he was abusing recreational drugs.  Pt voiced understanding. Pt was willing to try a different SSRI, will give trial of lexapro.   Return in about 4 weeks (around 06/12/2021) for f/u anxiety.   06/02/2021

## 2021-05-16 LAB — CMP14+EGFR
ALT: 7 IU/L (ref 0–44)
AST: 15 IU/L (ref 0–40)
Albumin/Globulin Ratio: 2 (ref 1.2–2.2)
Albumin: 4.7 g/dL (ref 4.0–5.0)
Alkaline Phosphatase: 119 IU/L (ref 44–121)
BUN/Creatinine Ratio: 14 (ref 9–20)
BUN: 12 mg/dL (ref 6–24)
Bilirubin Total: 0.4 mg/dL (ref 0.0–1.2)
CO2: 24 mmol/L (ref 20–29)
Calcium: 9.6 mg/dL (ref 8.7–10.2)
Chloride: 102 mmol/L (ref 96–106)
Creatinine, Ser: 0.86 mg/dL (ref 0.76–1.27)
Globulin, Total: 2.4 g/dL (ref 1.5–4.5)
Glucose: 86 mg/dL (ref 65–99)
Potassium: 4.5 mmol/L (ref 3.5–5.2)
Sodium: 138 mmol/L (ref 134–144)
Total Protein: 7.1 g/dL (ref 6.0–8.5)
eGFR: 108 mL/min/{1.73_m2} (ref >59)

## 2021-05-19 LAB — TOXASSURE SELECT 13 (MW), URINE

## 2021-05-19 LAB — SPECIMEN STATUS REPORT

## 2021-05-22 MED ORDER — CLONAZEPAM 1 MG PO TABS: 1.0000 mg | ORAL_TABLET | Freq: Two times a day (BID) | ORAL | 2 refills | Status: DC | PRN

## 2021-05-25 LAB — PSA: Prostate Specific Ag, Serum: 0.4 ng/mL (ref 0.0–4.0)

## 2021-05-25 LAB — SPECIMEN STATUS REPORT

## 2021-05-29 NOTE — Progress Notes (Signed)
Left message to return call 

## 2021-06-13 ENCOUNTER — Telehealth (INDEPENDENT_AMBULATORY_CARE_PROVIDER_SITE_OTHER): Payer: No Typology Code available for payment source | Admitting: Family Medicine

## 2021-06-13 ENCOUNTER — Other Ambulatory Visit: Payer: Self-pay

## 2021-06-13 DIAGNOSIS — Z8616 Personal history of COVID-19: Secondary | ICD-10-CM

## 2021-06-13 DIAGNOSIS — U071 COVID-19: Secondary | ICD-10-CM

## 2021-06-13 DIAGNOSIS — F418 Other specified anxiety disorders: Secondary | ICD-10-CM

## 2021-06-13 MED ORDER — CLONAZEPAM 1 MG PO TABS: 1.0000 mg | ORAL_TABLET | Freq: Two times a day (BID) | ORAL | 1 refills | Status: DC | PRN

## 2021-06-13 MED ORDER — ALBUTEROL SULFATE HFA 108 (90 BASE) MCG/ACT IN AERS: 2.0000 | INHALATION_SPRAY | Freq: Four times a day (QID) | RESPIRATORY_TRACT | 0 refills | Status: DC | PRN

## 2021-06-13 NOTE — Progress Notes (Signed)
Patient ID: Thomas Hobbs, male    DOB: December 31, 1975, 45 y.o.   MRN: [id number]   I connected with  Thomas Hobbs on 06/13/21 by a video enabled telemedicine application and verified that I am speaking with the correct person using two identifiers.   I discussed the limitations of evaluation and management by telemedicine. The patient expressed understanding and agreed to proceed.  Patient location: home  Provider location: in office  I provided 15 minutes of non face - to - face time during this encounter.    Chief Complaint  Patient presents with   Anxiety    Depression - stopped taking lexapro and tamsulosin - side effects were nausea, headaches, eye pain  Gad 7 - 15 Phq9- 13   post covid cough and congestion     Had covid about 3 weeks ago   Taking alka seltzer plus cold and nyquil  Subjective:    HPI  Anxiety/depression- pt took the lexapro for 3 days and discontinued. Due to having side effects, of nausea, headache, and eye pain.  Also started this medication-Flomax-  Had some nausea and headache. Sexual dysfunction with tamsulosin. Not wanting to take this.  Lexapro- was on it for 3 days.  Noticing some left eye tear duct and tender to touch.  Having some nausea and headaches.  Not wanting to talk to psychiatrist. Still helping with clonazepam 1mg  bid.  Helping with his anxiety.  Had gad and depression-long standing for years.  Had covid 3 wks ago-  Taking alka seltzer and cold and nyquil. Lingering coughing Waking up at night.  Smoker.  Medical History Thomas Hobbs has a past medical history of Depression, GERD (gastroesophageal reflux disease), and Hydrocele.   Outpatient Encounter Medications as of 06/13/2021  Medication Sig   albuterol (VENTOLIN HFA) 108 (90 Base) MCG/ACT inhaler Inhale 2 puffs into the lungs every 6 (six) hours as needed for wheezing or shortness of breath.   [DISCONTINUED] clonazePAM (KLONOPIN) 1 MG tablet Take 1 tablet (1 mg total) by  mouth 2 (two) times daily as needed for anxiety.   [DISCONTINUED] pantoprazole (PROTONIX) 40 MG tablet Take 1 tablet (40 mg total) by mouth daily.   [START ON 08/22/2021] clonazePAM (KLONOPIN) 1 MG tablet Take 1 tablet (1 mg total) by mouth 2 (two) times daily as needed for anxiety. Take 1 tablet (1 mg total) by mouth 2 (two) times daily as needed for anxiety.   tamsulosin (FLOMAX) 0.4 MG CAPS capsule Take 1 capsule (0.4 mg total) by mouth daily.   [DISCONTINUED] escitalopram (LEXAPRO) 10 MG tablet Take 1 tablet (10 mg total) by mouth daily.   No facility-administered encounter medications on file as of 06/13/2021.     Review of Systems  Constitutional:  Negative for chills and fever.  HENT:  Negative for congestion, rhinorrhea and sore throat.   Respiratory:  Positive for cough. Negative for shortness of breath and wheezing.   Cardiovascular:  Negative for chest pain and leg swelling.  Gastrointestinal:  Negative for abdominal pain, diarrhea, nausea and vomiting.  Genitourinary:  Negative for dysuria and frequency.  Skin:  Negative for rash.  Neurological:  Negative for dizziness, weakness and headaches.  Psychiatric/Behavioral:  Positive for dysphoric mood. Negative for self-injury, sleep disturbance and suicidal ideas. The patient is nervous/anxious.     Vitals There were no vitals taken for this visit.  Objective:   Physical Exam No PE due to phone visit.  Assessment and Plan   1. Depression with anxiety -  clonazePAM (KLONOPIN) 1 MG tablet; Take 1 tablet (1 mg total) by mouth 2 (two) times daily as needed for anxiety. Take 1 tablet (1 mg total) by mouth 2 (two) times daily as needed for anxiety.  Dispense: 30 tablet; Refill: 1  2. COVID - albuterol (VENTOLIN HFA) 108 (90 Base) MCG/ACT inhaler; Inhale 2 puffs into the lungs every 6 (six) hours as needed for wheezing or shortness of breath.  Dispense: 18 g; Refill: 0   H/o depression and anxiety- chronic.  Not wanting to try  SSRI, was on zoloft may years ago.   Tried lexapro last month for 3 days and stopped.   Discussed with pt option to take trintellix or viibryd.  Not wanting to talk to psychiatrist at this time.  Tried to give referral many times over the past year. May want to restart back on zoloft if wanting to go onto a med again, may be willing to try this again.  But wants to think about.    Return in about 3 months (around 09/13/2021) for f/u anxiety.

## 2021-06-15 ENCOUNTER — Telehealth: Payer: Self-pay | Admitting: Family Medicine

## 2021-06-15 MED ORDER — SERTRALINE HCL 25 MG PO TABS
ORAL_TABLET | ORAL | 0 refills | Status: DC
Start: 2021-06-15 — End: 2021-07-12

## 2021-06-15 NOTE — Telephone Encounter (Signed)
Patient notified and scheduled follow up phone visit with Dr Ladona Ridgel in one month

## 2021-06-15 NOTE — Telephone Encounter (Signed)
Patient states just had phone visit with you and know wanting to try the zoloft medication you suggested at appointment. He would like instruction and the best dosage you suggest he start with.Marland Kitchen

## 2021-06-15 NOTE — Telephone Encounter (Signed)
Will send in zoloft  Start with 25mg  daily after 1 wk, then increase to 50mg  daily. Will start low then go up from here.  In past pt was on 100mg  zoloft daily.   F/u 4 wks phone visit for anxiety.   Thx,   Dr. 

## 2021-06-22 ENCOUNTER — Other Ambulatory Visit: Payer: Self-pay | Admitting: Family Medicine

## 2021-06-22 DIAGNOSIS — K2100 Gastro-esophageal reflux disease with esophagitis, without bleeding: Secondary | ICD-10-CM

## 2021-07-01 ENCOUNTER — Encounter: Payer: Self-pay | Admitting: Family Medicine

## 2021-07-05 ENCOUNTER — Other Ambulatory Visit: Payer: Self-pay | Admitting: Family Medicine

## 2021-07-05 DIAGNOSIS — U071 COVID-19: Secondary | ICD-10-CM

## 2021-07-12 ENCOUNTER — Other Ambulatory Visit: Payer: Self-pay | Admitting: Family Medicine

## 2021-07-16 ENCOUNTER — Telehealth: Payer: Self-pay

## 2021-07-16 ENCOUNTER — Telehealth (INDEPENDENT_AMBULATORY_CARE_PROVIDER_SITE_OTHER): Payer: No Typology Code available for payment source | Admitting: Family Medicine

## 2021-07-16 ENCOUNTER — Other Ambulatory Visit: Payer: Self-pay

## 2021-07-16 DIAGNOSIS — F418 Other specified anxiety disorders: Secondary | ICD-10-CM | POA: Diagnosis not present

## 2021-07-16 DIAGNOSIS — K2100 Gastro-esophageal reflux disease with esophagitis, without bleeding: Secondary | ICD-10-CM | POA: Diagnosis not present

## 2021-07-16 MED ORDER — SERTRALINE HCL 50 MG PO TABS: 75.0000 mg | ORAL_TABLET | Freq: Every day | ORAL | 2 refills | Status: DC

## 2021-07-16 MED ORDER — CLONAZEPAM 1 MG PO TABS: 1.0000 mg | ORAL_TABLET | Freq: Two times a day (BID) | ORAL | 2 refills | Status: DC | PRN

## 2021-07-16 MED ORDER — PANTOPRAZOLE SODIUM 40 MG PO TBEC: 40.0000 mg | DELAYED_RELEASE_TABLET | Freq: Every day | ORAL | 0 refills | Status: DC

## 2021-07-16 MED ORDER — HYDROXYZINE PAMOATE 25 MG PO CAPS: 25.0000 mg | ORAL_CAPSULE | Freq: Three times a day (TID) | ORAL | 0 refills | Status: DC | PRN

## 2021-07-16 NOTE — Progress Notes (Signed)
I connected with  Thomas Hobbs on 07/23/21 by a phone enabled telemedicine application and verified that I am speaking with the correct person using two identifiers.   I discussed the limitations of evaluation and management by telemedicine. The patient expressed understanding and agreed to proceed.  Patient location: home  Provider location: in office  I provided 15 minutes of non face - to - face time during this encounter.    Patient ID: Thomas Hobbs, male    DOB: Sep 29, 1976, 45 y.o.   MRN: [id number]   Chief Complaint  Patient presents with   Depression    Phq9- 16   Anxiety    Gad 7 - 10   Subjective:    HPI F/u depression /anxiety. Had a few side effects at first when he started on zoloft.  Some times has clenching in the jaw is still happening. - thinking it is helping with the depression side. 50-60% better. Not much helping for the anxiety part yet.  Started on 25mg  zoloft and then inc to 50mg  zoloft.  Had some muscle spasm in arm at first, then last few days resolved.  Headaches 3-4 days in a row daily. Tried lexapro last month and had too many side effects and discontinued.  Taking the clonzepam at night. Start feeling increase anxiety between lunch and 4pm. On edge and chest tightening feeling. Then can take one at night.  Pt wanting to try hydroxyzine at night to see if will help with his anxiety.  And to continue to take clonazepam prn during the day.    Medical History [place] has a past medical history of Depression, GERD (gastroesophageal reflux disease), and Hydrocele.   Outpatient Encounter Medications as of 07/16/2021  Medication Sig   hydrOXYzine (VISTARIL) 25 MG capsule Take 1 capsule (25 mg total) by mouth every 8 (eight) hours as needed for anxiety.   sertraline (ZOLOFT) 50 MG tablet Take 1.5 tablets (75 mg total) by mouth daily.   [START ON 08/22/2021] clonazePAM (KLONOPIN) 1 MG tablet Take 1 tablet (1 mg total) by mouth 2 (two) times  daily as needed for anxiety. Take 1 tablet (1 mg total) by mouth 2 (two) times daily as needed for anxiety.   [START ON 09/16/2021] pantoprazole (PROTONIX) 40 MG tablet Take 1 tablet (40 mg total) by mouth daily.   [DISCONTINUED] albuterol (VENTOLIN HFA) 108 (90 Base) MCG/ACT inhaler TAKE 2 PUFFS BY MOUTH EVERY 6 HOURS AS NEEDED FOR WHEEZE OR SHORTNESS OF BREATH   [DISCONTINUED] clonazePAM (KLONOPIN) 1 MG tablet Take 1 tablet (1 mg total) by mouth 2 (two) times daily as needed for anxiety. Take 1 tablet (1 mg total) by mouth 2 (two) times daily as needed for anxiety.   [DISCONTINUED] pantoprazole (PROTONIX) 40 MG tablet TAKE 1 TABLET DAILY   [DISCONTINUED] sertraline (ZOLOFT) 25 MG tablet TAKE 2 TABLETS BY MOUTH DAILY (Patient taking differently: Take 50 mg by mouth daily. TAKE 2 TABLETS BY MOUTH DAILY)   [DISCONTINUED] tamsulosin (FLOMAX) 0.4 MG CAPS capsule Take 1 capsule (0.4 mg total) by mouth daily.   No facility-administered encounter medications on file as of 07/16/2021.     Review of Systems  Constitutional:  Negative for chills and fever.  HENT:  Negative for congestion, rhinorrhea and sore throat.   Respiratory:  Negative for cough, shortness of breath and wheezing.   Cardiovascular:  Negative for chest pain and leg swelling.  Gastrointestinal:  Negative for abdominal pain, diarrhea, nausea and vomiting.  Genitourinary:  Negative  for dysuria and frequency.  Skin:  Negative for rash.  Neurological:  Negative for dizziness, weakness and headaches.  Psychiatric/Behavioral:  Positive for agitation (improving), dysphoric mood (improving with meds) and sleep disturbance. Negative for self-injury and suicidal ideas. The patient is nervous/anxious.     Vitals There were no vitals taken for this visit.  Objective:   Physical Exam No PE due to phone visit.   Assessment and Plan   1. Depression with anxiety - clonazePAM (KLONOPIN) 1 MG tablet; Take 1 tablet (1 mg total) by mouth 2  (two) times daily as needed for anxiety. Take 1 tablet (1 mg total) by mouth 2 (two) times daily as needed for anxiety.  Dispense: 30 tablet; Refill: 2  2. Gastroesophageal reflux disease with esophagitis without hemorrhage - pantoprazole (PROTONIX) 40 MG tablet; Take 1 tablet (40 mg total) by mouth daily.  Dispense: 90 tablet; Refill: 0   Will increase from 50mg  zoloft to 75mg  zoloft. Pt wanting to cont with clonazepam for now. Just taking prn. Wanting to try hydroxyzine to help with sleep.  Gave small course to try at night. Advised not to take both hydroxyzine and clonazepam at same time. Pt to call if having any concerns and will f/u with new provider in oct/nov for his anxiety/depression. Pt in agreement with plan.  Return in about 3 months (around 10/16/2021) for f//u depression/.anxiety.

## 2021-07-16 NOTE — Telephone Encounter (Signed)
I connected with  Thomas Hobbs on 07/16/21 by a video enabled telemedicine application and verified that I am speaking with the correct person using two identifiers.   I discussed the limitations of evaluation and management by telemedicine. The patient expressed understanding and agreed to proceed.

## 2021-07-30 ENCOUNTER — Other Ambulatory Visit: Payer: Self-pay | Admitting: Family Medicine

## 2021-07-30 DIAGNOSIS — F418 Other specified anxiety disorders: Secondary | ICD-10-CM

## 2021-07-30 NOTE — Telephone Encounter (Signed)
Pt advised to f/u with new provider for this concern of anxiety/insomnia. Dr. Ladona Ridgel

## 2021-08-29 ENCOUNTER — Other Ambulatory Visit: Payer: Self-pay | Admitting: Family Medicine

## 2021-08-29 DIAGNOSIS — F418 Other specified anxiety disorders: Secondary | ICD-10-CM

## 2021-09-16 ENCOUNTER — Other Ambulatory Visit: Payer: Self-pay | Admitting: Family Medicine

## 2021-09-27 ENCOUNTER — Ambulatory Visit: Payer: No Typology Code available for payment source | Admitting: Family Medicine

## 2021-10-02 ENCOUNTER — Ambulatory Visit (INDEPENDENT_AMBULATORY_CARE_PROVIDER_SITE_OTHER): Payer: No Typology Code available for payment source | Admitting: Family Medicine

## 2021-10-02 ENCOUNTER — Other Ambulatory Visit: Payer: Self-pay

## 2021-10-02 DIAGNOSIS — F418 Other specified anxiety disorders: Secondary | ICD-10-CM

## 2021-10-02 MED ORDER — BUSPIRONE HCL 7.5 MG PO TABS: 7.5000 mg | ORAL_TABLET | Freq: Two times a day (BID) | ORAL | 2 refills | Status: DC

## 2021-10-02 MED ORDER — SERTRALINE HCL 100 MG PO TABS: 100.0000 mg | ORAL_TABLET | Freq: Every day | ORAL | 3 refills | Status: DC

## 2021-10-02 NOTE — Progress Notes (Signed)
Subjective:  Patient ID: Thomas Hobbs, male    DOB: 1976-08-07  Age: 45 y.o. MRN: [id number]  CC: Chief Complaint  Patient presents with   Depression    And anxiety follow up    HPI:  45 year old male presents for follow-up regarding depression and anxiety.  Patient states that his depression has improved but he continues to struggle with significant anxiety.  He is currently on Zoloft 75 mg daily.  He takes Klonopin 1-2 times daily as needed for anxiety.  He mostly takes it in the evening.  He states that his anxiety continues to be uncontrolled.  He states that he has had life stressors and money stressors which are contributing.  He would like to discuss additional treatment options today.  PHQ-9: 11. GAD-7: 17.  Patient Active Problem List   Diagnosis Date Noted   Esophageal reflux 11/05/2014   Essential tremor 03/31/2014   Depression with anxiety 02/13/2013   Contact dermatitis 02/13/2013    Social Hx   Social History   Socioeconomic History   Marital status: Married    Spouse name: Not on file   Number of children: Not on file   Years of education: Not on file   Highest education level: Not on file  Occupational History   Not on file  Tobacco Use   Smoking status: Every Day    Packs/day: 1.50    Types: Cigarettes   Smokeless tobacco: Never  Vaping Use   Vaping Use: Never used  Substance and Sexual Activity   Alcohol use: No    Comment: occ   Drug use: No   Sexual activity: Yes    Birth control/protection: None  Other Topics Concern   Not on file  Social History Narrative   Not on file   Social Determinants of Health   Financial Resource Strain: Not on file  Food Insecurity: Not on file  Transportation Needs: Not on file  Physical Activity: Not on file  Stress: Not on file  Social Connections: Not on file    Review of Systems  Constitutional: Negative.   Psychiatric/Behavioral:  The patient is nervous/anxious.     Objective:  BP 126/79    Pulse 82   Ht 5' 4.5" (1.638 m)   Wt 138 lb 12.8 oz (63 kg)   SpO2 98%   BMI 23.46 kg/m   BP/Weight 10/02/2021 05/15/2021 10/26/2020  Systolic BP 126 123 112  Diastolic BP 79 79 72  Wt. (Lbs) 138.8 134 141  BMI 23.46 22.65 23.83    Physical Exam Constitutional:      General: He is not in acute distress.    Appearance: Normal appearance. He is not ill-appearing.  HENT:     Head: Normocephalic and atraumatic.  Eyes:     General:        Right eye: No discharge.        Left eye: No discharge.     Conjunctiva/sclera: Conjunctivae normal.  Cardiovascular:     Rate and Rhythm: Normal rate and regular rhythm.  Pulmonary:     Effort: Pulmonary effort is normal.     Breath sounds: Normal breath sounds.  Musculoskeletal:     Comments: Scoliosis noted.  Neurological:     Mental Status: He is alert.  Psychiatric:        Mood and Affect: Mood normal.        Behavior: Behavior normal.    Lab Results  Component Value Date   WBC 6.9 05/12/2020  HGB 15.7 05/12/2020   HCT 48.1 05/12/2020   PLT 249 05/12/2020   GLUCOSE 86 05/15/2021   CHOL 182 05/12/2020   TRIG 156 (H) 05/12/2020   HDL 26 (L) 05/12/2020   LDLCALC 128 (H) 05/12/2020   ALT 7 05/15/2021   AST 15 05/15/2021   NA 138 05/15/2021   K 4.5 05/15/2021   CL 102 05/15/2021   CREATININE 0.86 05/15/2021   BUN 12 05/15/2021   CO2 24 05/15/2021   TSH 1.970 05/12/2020     Assessment & Plan:   Problem List Items Addressed This Visit       Other   Depression with anxiety    Uncontrolled, particularly the anxiety component.  Increasing Zoloft to 100 mg daily.  Adding BuSpar.      Relevant Medications   busPIRone (BUSPAR) 7.5 MG tablet   sertraline (ZOLOFT) 100 MG tablet    Meds ordered this encounter  Medications   busPIRone (BUSPAR) 7.5 MG tablet    Sig: Take 1 tablet (7.5 mg total) by mouth 2 (two) times daily.    Dispense:  60 tablet    Refill:  2   sertraline (ZOLOFT) 100 MG tablet    Sig: Take 1  tablet (100 mg total) by mouth daily.    Dispense:  90 tablet    Refill:  3    Follow-up:  6 weeks.  Everlene Other DO [facility]

## 2021-10-02 NOTE — Patient Instructions (Signed)
I have increased the Zoloft to 100 mg daily.  Buspar twice daily.  Klonopin as needed.  Follow up in 6 weeks.

## 2021-10-02 NOTE — Assessment & Plan Note (Signed)
Uncontrolled, particularly the anxiety component.  Increasing Zoloft to 100 mg daily.  Adding BuSpar.

## 2021-10-03 ENCOUNTER — Other Ambulatory Visit: Payer: Self-pay | Admitting: Family Medicine

## 2021-10-03 ENCOUNTER — Telehealth: Payer: Self-pay | Admitting: Family Medicine

## 2021-10-03 DIAGNOSIS — F418 Other specified anxiety disorders: Secondary | ICD-10-CM

## 2021-10-03 MED ORDER — CLONAZEPAM 1 MG PO TABS: 1.0000 mg | ORAL_TABLET | Freq: Two times a day (BID) | ORAL | 3 refills | Status: DC | PRN

## 2021-10-03 NOTE — Telephone Encounter (Signed)
Please advise. Thank you

## 2021-10-03 NOTE — Telephone Encounter (Signed)
Patient was seen yesterday and one of his medication was not called in clonazepam 1 mg ,he states take twice a day and is completely out . [facility] [place]

## 2021-10-03 NOTE — Telephone Encounter (Signed)
Left detailed message (OK per DPR)

## 2021-11-13 ENCOUNTER — Ambulatory Visit: Payer: No Typology Code available for payment source | Admitting: Family Medicine

## 2021-11-26 ENCOUNTER — Other Ambulatory Visit: Payer: Self-pay | Admitting: Family Medicine

## 2021-11-26 DIAGNOSIS — K2100 Gastro-esophageal reflux disease with esophagitis, without bleeding: Secondary | ICD-10-CM

## 2021-11-28 ENCOUNTER — Other Ambulatory Visit: Payer: Self-pay

## 2021-11-28 ENCOUNTER — Ambulatory Visit (INDEPENDENT_AMBULATORY_CARE_PROVIDER_SITE_OTHER): Payer: No Typology Code available for payment source | Admitting: Family Medicine

## 2021-11-28 VITALS — BP 118/70 | HR 81 | Temp 97.6°F | Ht 64.5 in | Wt 140.0 lb

## 2021-11-28 DIAGNOSIS — F418 Other specified anxiety disorders: Secondary | ICD-10-CM | POA: Diagnosis not present

## 2021-11-28 DIAGNOSIS — R051 Acute cough: Secondary | ICD-10-CM | POA: Insufficient documentation

## 2021-11-28 NOTE — Patient Instructions (Signed)
Continue your current meds.  Call for refills (if needed).  Follow up in 6 months.

## 2021-11-28 NOTE — Progress Notes (Signed)
Subjective:  Patient ID: Thomas Hobbs, male    DOB: 08/26/1976  Age: 46 y.o. MRN: [id number]  CC: Chief Complaint  Patient presents with   depression with anxiety    Follow up , has discontinued taking buspar in mid December due to more frequent headaches    HPI:  46 year old male presents for follow-up regarding depression and anxiety.  Patient has recently stopped BuSpar due to having more frequent headaches.  He states that he also did not seem to notice a difference with the medication.  He is compliant with Klonopin and Zoloft.  He states that his symptoms are stable and improved from months ago.  He feels like he is doing okay at this time.  GAD-7 score of 8.  PHQ-9 score of 12.  Denies SI.  Does not wish to make any medication changes today.  Patient reports a recent cough and congestion.  No fever.  Started approximate 1.5 weeks ago.  Patient Active Problem List   Diagnosis Date Noted   Acute cough 11/28/2021   Esophageal reflux 11/05/2014   Essential tremor 03/31/2014   Depression with anxiety 02/13/2013   Contact dermatitis 02/13/2013    Social Hx   Social History   Socioeconomic History   Marital status: Married    Spouse name: Not on file   Number of children: Not on file   Years of education: Not on file   Highest education level: Not on file  Occupational History   Not on file  Tobacco Use   Smoking status: Every Day    Packs/day: 1.50    Types: Cigarettes   Smokeless tobacco: Never  Vaping Use   Vaping Use: Never used  Substance and Sexual Activity   Alcohol use: No    Comment: occ   Drug use: No   Sexual activity: Yes    Birth control/protection: None  Other Topics Concern   Not on file  Social History Narrative   Not on file   Social Determinants of Health   Financial Resource Strain: Not on file  Food Insecurity: Not on file  Transportation Needs: Not on file  Physical Activity: Not on file  Stress: Not on file  Social Connections:  Not on file    Review of Systems Per HPI  Objective:  BP 118/70    Pulse 81    Temp 97.6 F (36.4 C)    Ht 5' 4.5" (1.638 m)    Wt 140 lb (63.5 kg)    SpO2 98%    BMI 23.66 kg/m   BP/Weight 11/28/2021 10/02/2021 05/15/2021  Systolic BP 118 126 123  Diastolic BP 70 79 79  Wt. (Lbs) 140 138.8 134  BMI 23.66 23.46 22.65    Physical Exam Vitals and nursing note reviewed.  Constitutional:      General: He is not in acute distress.    Appearance: Normal appearance. He is not ill-appearing.  Cardiovascular:     Rate and Rhythm: Normal rate and regular rhythm.  Pulmonary:     Effort: Pulmonary effort is normal.     Breath sounds: Normal breath sounds. No wheezing, rhonchi or rales.  Neurological:     Mental Status: He is alert.  Psychiatric:        Mood and Affect: Mood normal.        Behavior: Behavior normal.    Lab Results  Component Value Date   WBC 6.9 05/12/2020   HGB 15.7 05/12/2020   HCT 48.1 05/12/2020  PLT 249 05/12/2020   GLUCOSE 86 05/15/2021   CHOL 182 05/12/2020   TRIG 156 (H) 05/12/2020   HDL 26 (L) 05/12/2020   LDLCALC 128 (H) 05/12/2020   ALT 7 05/15/2021   AST 15 05/15/2021   NA 138 05/15/2021   K 4.5 05/15/2021   CL 102 05/15/2021   CREATININE 0.86 05/15/2021   BUN 12 05/15/2021   CO2 24 05/15/2021   TSH 1.970 05/12/2020     Assessment & Plan:   Problem List Items Addressed This Visit       Other   Depression with anxiety - Primary    Stable.  Continue Zoloft and Klonopin.      Acute cough    Lungs clear.  Supportive care.  Continue to monitor closely.  If fails to improve or worsens, he will let me know.       Follow-up:  Return in about 6 months (around 05/28/2022).  Everlene Other DO [facility]

## 2021-11-28 NOTE — Assessment & Plan Note (Signed)
Lungs clear.  Supportive care.  Continue to monitor closely.  If fails to improve or worsens, he will let me know.

## 2021-11-28 NOTE — Assessment & Plan Note (Signed)
Stable.  Continue Zoloft and Klonopin.

## 2022-01-15 ENCOUNTER — Encounter: Payer: Self-pay | Admitting: Family Medicine

## 2022-01-16 ENCOUNTER — Other Ambulatory Visit: Payer: Self-pay

## 2022-01-16 ENCOUNTER — Ambulatory Visit: Payer: No Typology Code available for payment source | Admitting: Family Medicine

## 2022-01-16 VITALS — BP 113/72 | HR 56 | Temp 98.9°F | Ht 64.5 in | Wt 139.8 lb

## 2022-01-16 DIAGNOSIS — Q181 Preauricular sinus and cyst: Secondary | ICD-10-CM | POA: Insufficient documentation

## 2022-01-16 MED ORDER — DOXYCYCLINE HYCLATE 100 MG PO TABS: 100.0000 mg | ORAL_TABLET | Freq: Two times a day (BID) | ORAL | 0 refills | Status: DC

## 2022-01-16 NOTE — Assessment & Plan Note (Signed)
Cyst versus pustule in the left ear canal.  Favored to be a cyst as he has had multiple recurrences which resolved without treatment.  Placing on doxycycline to cover for infectious process.  Offered referral to ENT and patient declined stating that he cannot afford it. ?

## 2022-01-16 NOTE — Patient Instructions (Signed)
Medication as prescribed. ? ?I am placing a referral to ENT. ? ?If worsens, please let me know ? ?Dr. Adriana Simas  ?

## 2022-01-16 NOTE — Progress Notes (Signed)
? ?Subjective:  ?Patient ID: Thomas Hobbs, male    DOB: 17-Feb-1976  Age: 46 y.o. MRN: [id number] ? ?CC: ?Chief Complaint  ?Patient presents with  ? Cyst  ?  In left ear that has ruptured. Ear is still draining.  ? ? ?HPI: ? ?46 year old male presents for evaluation the above. ? ?Patient states that he has had a recurrent "cyst" in his left ear.  He states that this has happened several times over the past few years.  He states that recently developed again and he had some discomfort.  He has "popped it".  He has had some drainage from the ear.  He had some facial pain and pain around the ear concerning for possible infection.  This prompted him to come in for evaluation.  No fever.  He has been applying peroxide to the area.  No other complaints or concerns at this time. ? ?Patient Active Problem List  ? Diagnosis Date Noted  ? Cyst of ear canal 01/16/2022  ? Esophageal reflux 11/05/2014  ? Essential tremor 03/31/2014  ? Depression with anxiety 02/13/2013  ? Contact dermatitis 02/13/2013  ? ? ?Social Hx   ?Social History  ? ?Socioeconomic History  ? Marital status: Married  ?  Spouse name: Not on file  ? Number of children: Not on file  ? Years of education: Not on file  ? Highest education level: Not on file  ?Occupational History  ? Not on file  ?Tobacco Use  ? Smoking status: Every Day  ?  Packs/day: 1.50  ?  Types: Cigarettes  ? Smokeless tobacco: Never  ?Vaping Use  ? Vaping Use: Never used  ?Substance and Sexual Activity  ? Alcohol use: No  ?  Comment: occ  ? Drug use: No  ? Sexual activity: Yes  ?  Birth control/protection: None  ?Other Topics Concern  ? Not on file  ?Social History Narrative  ? Not on file  ? ?Social Determinants of Health  ? ?Financial Resource Strain: Not on file  ?Food Insecurity: Not on file  ?Transportation Needs: Not on file  ?Physical Activity: Not on file  ?Stress: Not on file  ?Social Connections: Not on file  ? ? ?Review of Systems ?Per HPI ? ?Objective:  ?BP 113/72   Pulse (!)  56   Temp 98.9 ?F (37.2 ?C) (Oral)   Ht 5' 4.5" (1.638 m)   Wt 139 lb 12.8 oz (63.4 kg)   SpO2 98%   BMI 23.63 kg/m?  ? ?BP/Weight 01/16/2022 11/28/2021 10/02/2021  ?Systolic BP 113 118 126  ?Diastolic BP 72 70 79  ?Wt. (Lbs) 139.8 140 138.8  ?BMI 23.63 23.66 23.46  ? ? ?Physical Exam ?Vitals and nursing note reviewed.  ?Constitutional:   ?   General: He is not in acute distress. ?   Appearance: Normal appearance. He is not ill-appearing.  ?HENT:  ?   Head: Normocephalic and atraumatic.  ?   Ears:  ?   Comments: Left ear canal with a small raised area noted. ?Pulmonary:  ?   Effort: Pulmonary effort is normal. No respiratory distress.  ?Neurological:  ?   Mental Status: He is alert.  ? ? ?Lab Results  ?Component Value Date  ? WBC 6.9 05/12/2020  ? HGB 15.7 05/12/2020  ? HCT 48.1 05/12/2020  ? PLT 249 05/12/2020  ? GLUCOSE 86 05/15/2021  ? CHOL 182 05/12/2020  ? TRIG 156 (H) 05/12/2020  ? HDL 26 (L) 05/12/2020  ? LDLCALC 128 (H)  05/12/2020  ? ALT 7 05/15/2021  ? AST 15 05/15/2021  ? NA 138 05/15/2021  ? K 4.5 05/15/2021  ? CL 102 05/15/2021  ? CREATININE 0.86 05/15/2021  ? BUN 12 05/15/2021  ? CO2 24 05/15/2021  ? TSH 1.970 05/12/2020  ? ? ? ?Assessment & Plan:  ? ?Problem List Items Addressed This Visit   ? ?  ? Nervous and Auditory  ? Cyst of ear canal - Primary  ?  Cyst versus pustule in the left ear canal.  Favored to be a cyst as he has had multiple recurrences which resolved without treatment.  Placing on doxycycline to cover for infectious process.  Offered referral to ENT and patient declined stating that he cannot afford it. ?  ?  ? ? ?Meds ordered this encounter  ?Medications  ? doxycycline (VIBRA-TABS) 100 MG tablet  ?  Sig: Take 1 tablet (100 mg total) by mouth 2 (two) times daily.  ?  Dispense:  14 tablet  ?  Refill:  0  ? ?Everlene Other DO ?[facility] ? ?

## 2022-02-13 ENCOUNTER — Encounter: Payer: Self-pay | Admitting: Family Medicine

## 2022-02-13 ENCOUNTER — Ambulatory Visit: Payer: No Typology Code available for payment source | Admitting: Family Medicine

## 2022-02-13 VITALS — BP 121/80 | HR 59 | Temp 98.4°F | Wt 141.8 lb

## 2022-02-13 DIAGNOSIS — R350 Frequency of micturition: Secondary | ICD-10-CM

## 2022-02-13 DIAGNOSIS — R351 Nocturia: Secondary | ICD-10-CM | POA: Diagnosis not present

## 2022-02-13 DIAGNOSIS — N401 Enlarged prostate with lower urinary tract symptoms: Secondary | ICD-10-CM | POA: Diagnosis not present

## 2022-02-13 DIAGNOSIS — N400 Enlarged prostate without lower urinary tract symptoms: Secondary | ICD-10-CM | POA: Insufficient documentation

## 2022-02-13 LAB — POCT URINALYSIS DIPSTICK
Protein, UA: POSITIVE — AB
Spec Grav, UA: >=1.03 — AB (ref 1.010–1.025)
pH, UA: 6 (ref 5.0–8.0)

## 2022-02-13 MED ORDER — ALFUZOSIN HCL ER 10 MG PO TB24: 10.0000 mg | ORAL_TABLET | Freq: Every day | ORAL | 1 refills | Status: DC

## 2022-02-13 NOTE — Patient Instructions (Signed)
Medication as prescribed. ? ?Let me know how you're doing in the next 2 weeks. ? ?Dr. Adriana Simas  ?

## 2022-02-13 NOTE — Assessment & Plan Note (Signed)
Enlarged prostate noted on exam.  Patient has tried Flomax in the past.  Did not tolerate.  Placing on alfuzosin.  Advised to decrease his fluid intake particularly after dinner.  Offered urology referral.  Patient does not want to proceed with referral at this time due to cost.  Patient is to message me in 2 weeks to let me know how he is doing. ?

## 2022-02-13 NOTE — Progress Notes (Signed)
? ?Subjective:  ?Patient ID: Thomas Hobbs, male    DOB: 1976-01-17  Age: 46 y.o. MRN: [id number] ? ?CC: ?Chief Complaint  ?Patient presents with  ? Urinary Frequency  ?  Getting up multiple times during the night. Worsening over the past few years. Pt has recently slept through urge and wet the bed  ? ? ?HPI: ? ?46 year old male presents for evaluation of the above. ? ?Patient states that he has had urinary frequency and nocturia for the past 3 to 4 years.  It has slowly been worsening.  He states that now he gets up 3-6 times a night to urinate.  He has urinary frequency and urgency during the day but this is manageable.  He states that he has a decreased stream.  Given the fact that he is getting up so frequently at night, he is getting very little sleep.  He states that as of the past few weeks he has wet the bed.  Denies abdominal pain.  No dysuria.  No reports of hematuria. ? ?Patient Active Problem List  ? Diagnosis Date Noted  ? BPH (benign prostatic hyperplasia) 02/13/2022  ? Cyst of ear canal 01/16/2022  ? Esophageal reflux 11/05/2014  ? Depression with anxiety 02/13/2013  ? Contact dermatitis 02/13/2013  ? ? ?Social Hx   ?Social History  ? ?Socioeconomic History  ? Marital status: Married  ?  Spouse name: Not on file  ? Number of children: Not on file  ? Years of education: Not on file  ? Highest education level: Not on file  ?Occupational History  ? Not on file  ?Tobacco Use  ? Smoking status: Every Day  ?  Packs/day: 1.50  ?  Types: Cigarettes  ? Smokeless tobacco: Never  ?Vaping Use  ? Vaping Use: Never used  ?Substance and Sexual Activity  ? Alcohol use: No  ?  Comment: occ  ? Drug use: No  ? Sexual activity: Yes  ?  Birth control/protection: None  ?Other Topics Concern  ? Not on file  ?Social History Narrative  ? Not on file  ? ?Social Determinants of Health  ? ?Financial Resource Strain: Not on file  ?Food Insecurity: Not on file  ?Transportation Needs: Not on file  ?Physical Activity: Not on file   ?Stress: Not on file  ?Social Connections: Not on file  ? ? ?Review of Systems ?Per HPI ? ?Objective:  ?BP 121/80   Pulse (!) 59   Temp 98.4 ?F (36.9 ?C)   Wt 141 lb 12.8 oz (64.3 kg)   SpO2 99%   BMI 23.96 kg/m?  ? ? ?  02/13/2022  ?  9:57 AM 01/16/2022  ? 10:31 AM 11/28/2021  ? 10:10 AM  ?BP/Weight  ?Systolic BP 121 113 118  ?Diastolic BP 80 72 70  ?Wt. (Lbs) 141.8 139.8 140  ?BMI 23.96 kg/m2 23.63 kg/m2 23.66 kg/m2  ? ? ?Physical Exam ?Vitals reviewed.  ?Constitutional:   ?   General: He is not in acute distress. ?   Appearance: Normal appearance.  ?HENT:  ?   Head: Normocephalic and atraumatic.  ?Cardiovascular:  ?   Rate and Rhythm: Normal rate and regular rhythm.  ?Pulmonary:  ?   Effort: Pulmonary effort is normal.  ?   Breath sounds: Normal breath sounds.  ?Genitourinary: ?   Prostate: Enlarged. Not tender.  ?Neurological:  ?   Mental Status: He is alert.  ?Psychiatric:     ?   Mood and Affect: Mood normal.     ?  Behavior: Behavior normal.  ? ? ?Lab Results  ?Component Value Date  ? WBC 6.9 05/12/2020  ? HGB 15.7 05/12/2020  ? HCT 48.1 05/12/2020  ? PLT 249 05/12/2020  ? GLUCOSE 86 05/15/2021  ? CHOL 182 05/12/2020  ? TRIG 156 (H) 05/12/2020  ? HDL 26 (L) 05/12/2020  ? LDLCALC 128 (H) 05/12/2020  ? ALT 7 05/15/2021  ? AST 15 05/15/2021  ? NA 138 05/15/2021  ? K 4.5 05/15/2021  ? CL 102 05/15/2021  ? CREATININE 0.86 05/15/2021  ? BUN 12 05/15/2021  ? CO2 24 05/15/2021  ? TSH 1.970 05/12/2020  ? ? ? ?Assessment & Plan:  ? ?Problem List Items Addressed This Visit   ? ?  ? Genitourinary  ? BPH (benign prostatic hyperplasia) - Primary  ?  Enlarged prostate noted on exam.  Patient has tried Flomax in the past.  Did not tolerate.  Placing on alfuzosin.  Advised to decrease his fluid intake particularly after dinner.  Offered urology referral.  Patient does not want to proceed with referral at this time due to cost.  Patient is to message me in 2 weeks to let me know how he is doing. ?  ?  ? Relevant Medications   ? alfuzosin (UROXATRAL) 10 MG 24 hr tablet  ? ?Other Visit Diagnoses   ? ? Frequency of urination      ? Relevant Orders  ? POCT Urinalysis Dipstick (Completed)  ? ?  ? ? ?Meds ordered this encounter  ?Medications  ? alfuzosin (UROXATRAL) 10 MG 24 hr tablet  ?  Sig: Take 1 tablet (10 mg total) by mouth daily with breakfast.  ?  Dispense:  30 tablet  ?  Refill:  1  ? ?Everlene Other DO ?[facility] ? ?

## 2022-02-25 ENCOUNTER — Other Ambulatory Visit: Payer: Self-pay | Admitting: Family Medicine

## 2022-02-25 ENCOUNTER — Encounter: Payer: Self-pay | Admitting: Family Medicine

## 2022-02-25 DIAGNOSIS — R32 Unspecified urinary incontinence: Secondary | ICD-10-CM

## 2022-02-25 DIAGNOSIS — R079 Chest pain, unspecified: Secondary | ICD-10-CM

## 2022-03-04 ENCOUNTER — Other Ambulatory Visit: Payer: Self-pay

## 2022-03-04 DIAGNOSIS — F418 Other specified anxiety disorders: Secondary | ICD-10-CM

## 2022-03-04 MED ORDER — CLONAZEPAM 1 MG PO TABS: 1.0000 mg | ORAL_TABLET | Freq: Two times a day (BID) | ORAL | 3 refills | Status: DC | PRN

## 2022-03-13 ENCOUNTER — Ambulatory Visit (HOSPITAL_COMMUNITY)
Admission: RE | Admit: 2022-03-13 | Discharge: 2022-03-13 | Disposition: A | Discharge status: Home / Self Care | Payer: 59 | Source: Ambulatory Visit | Attending: Family Medicine | Admitting: Family Medicine

## 2022-03-13 DIAGNOSIS — R32 Unspecified urinary incontinence: Secondary | ICD-10-CM | POA: Insufficient documentation

## 2022-03-14 ENCOUNTER — Other Ambulatory Visit: Payer: Self-pay | Admitting: Family Medicine

## 2022-03-14 DIAGNOSIS — N320 Bladder-neck obstruction: Secondary | ICD-10-CM

## 2022-03-25 ENCOUNTER — Ambulatory Visit (INDEPENDENT_AMBULATORY_CARE_PROVIDER_SITE_OTHER): Payer: 59 | Admitting: Urology

## 2022-03-25 ENCOUNTER — Encounter: Payer: Self-pay | Admitting: Urology

## 2022-03-25 VITALS — BP 114/60 | HR 56

## 2022-03-25 DIAGNOSIS — N401 Enlarged prostate with lower urinary tract symptoms: Secondary | ICD-10-CM

## 2022-03-25 DIAGNOSIS — R351 Nocturia: Secondary | ICD-10-CM

## 2022-03-25 DIAGNOSIS — N320 Bladder-neck obstruction: Secondary | ICD-10-CM

## 2022-03-25 LAB — BLADDER SCAN AMB NON-IMAGING: Scan Result: 54

## 2022-03-25 MED ORDER — SILODOSIN 8 MG PO CAPS: 8.0000 mg | ORAL_CAPSULE | Freq: Every day | ORAL | 11 refills | Status: DC

## 2022-03-25 NOTE — Patient Instructions (Signed)

## 2022-03-25 NOTE — Progress Notes (Signed)
? ?03/25/2022 ?9:21 AM  ? ?Melissa Noon ?Dec 18, 1975 ?[id number] ? ?Referring provider: Coral Spikes, DO ?[address] ?[address] ?[place],  [place] [address] ? ?nocturia ? ? ?HPI: ?Mr Shehata is a 46yo here for evaluation of nocturia. IPSS 21 QOl 5 on uroxatral 10mg . Nocturia 5-6x which decreases to 3-4x with fluid restriction. He has daytime urinary urgency and frequency every 45 minutes. Urine stream fair. PVR 54cc. No straining to urinate. He is bilateral hydroceles and had an aspiration several years ago with Dr. Luberta Robertson.  ? ? ?PMH: ?Past Medical History:  ?Diagnosis Date  ? Depression   ? GERD (gastroesophageal reflux disease)   ? Hydrocele   ? ? ?Surgical History: ?Past Surgical History:  ?Procedure Laterality Date  ? BIOPSY  07/24/2016  ? Procedure: BIOPSY;  Surgeon: Rogene Houston, MD;  [facility]: [facility];  Service: Endoscopy;;  Gastric biopsy  ? ESOPHAGOGASTRODUODENOSCOPY N/A 07/24/2016  ? Procedure: ESOPHAGOGASTRODUODENOSCOPY (EGD);  Surgeon: Rogene Houston, MD;  Location: [facility];  Service: Endoscopy;  Laterality: N/A;  12:00  ? NO PAST SURGERIES    ? ? ?Home Medications:  ?Allergies as of 03/25/2022   ? ?   Reactions  ? Augmentin [amoxicillin-pot Clavulanate] Nausea Only  ? Cefzil [cefprozil] Itching  ? Levaquin [levofloxacin In D5w]   ? Chest tightness, dizziness, lightheaded, myalgia  ? Other Other (See Comments)  ? Patient states he is allergic to an antibiotic, unsure of name - possibly clindamycin (numbness and swelling to one side of face)  ? ?  ? ?  ?Medication List  ?  ? ?  ? Accurate as of Mar 25, 2022  9:21 AM. If you have any questions, ask your nurse or doctor.  ?  ?  ? ?  ? ?alfuzosin 10 MG 24 hr tablet ?Commonly known as: UROXATRAL ?Take 1 tablet (10 mg total) by mouth daily with breakfast. ?  ?clonazePAM 1 MG tablet ?Commonly known as: KLONOPIN ?Take 1 tablet (1 mg total) by mouth 2 (two) times daily as needed for anxiety. ?  ?pantoprazole 40 MG tablet ?Commonly known as: PROTONIX ?TAKE  1 TABLET DAILY ?  ?sertraline 100 MG tablet ?Commonly known as: Zoloft ?Take 1 tablet (100 mg total) by mouth daily. ?  ? ?  ? ? ?Allergies:  ?Allergies  ?Allergen Reactions  ? Augmentin [Amoxicillin-Pot Clavulanate] Nausea Only  ? Cefzil [Cefprozil] Itching  ? Levaquin [Levofloxacin In D5w]   ?  Chest tightness, dizziness, lightheaded, myalgia  ? Other Other (See Comments)  ?  Patient states he is allergic to an antibiotic, unsure of name - possibly clindamycin (numbness and swelling to one side of face)  ? ? ?Family History: ?Family History  ?Problem Relation Age of Onset  ? Heart attack Father   ? Heart attack Other   ? ? ?Social History:  reports that he has been smoking cigarettes. He has been smoking an average of 1.5 packs per day. He has never used smokeless tobacco. He reports that he does not drink alcohol and does not use drugs. ? ?ROS: ?All other review of systems were reviewed and are negative except what is noted above in HPI ? ?Physical Exam: ?BP 114/60   Pulse (!) 56   ?Constitutional:  Alert and oriented, No acute distress. ?HEENT: [place] AT, moist mucus membranes.  Trachea midline, no masses. ?Cardiovascular: No clubbing, cyanosis, or edema. ?Respiratory: Normal respiratory effort, no increased work of breathing. ?GI: Abdomen is soft, nontender, nondistended, no abdominal  masses ?GU: No CVA tenderness. Circumcised phallus. No masses/lesions on penis, testis, scrotum. Bilateral 8cm hydroceles. Prostate 40g smooth no nodules no induration.  ?Lymph: No cervical or inguinal lymphadenopathy. ?Skin: No rashes, bruises or suspicious lesions. ?Neurologic: Grossly intact, no focal deficits, moving all 4 extremities. ?Psychiatric: Normal mood and affect. ? ?Laboratory Data: ?Lab Results  ?Component Value Date  ? WBC 6.9 05/12/2020  ? HGB 15.7 05/12/2020  ? HCT 48.1 05/12/2020  ? MCV 93 05/12/2020  ? PLT 249 05/12/2020  ? ? ?Lab Results  ?Component Value Date  ? CREATININE 0.86 05/15/2021  ? ? ?No results found  for: PSA ? ?No results found for: TESTOSTERONE ? ?No results found for: HGBA1C ? ?Urinalysis ?   ?Component Value Date/Time  ? PROTEINUR Positive (A) 02/13/2022 1042  ? ? ?No results found for: LABMICR, [place], RBCUA, LABEPIT, MUCUS, BACTERIA ? ?Pertinent Imaging: ? ?Results for orders placed during the hospital encounter of 06/15/20 ? ?DG Abd 1 View ? ?Narrative ?CLINICAL DATA:  Constipation ? ?EXAM: ?ABDOMEN - 1 VIEW ? ?COMPARISON:  None. ? ?FINDINGS: ?Air and stool-filled nondilated loops of bowel. Moderate colonic ?stool burden diffusely throughout the colon. Pelvic phleboliths. ?Visualized lung bases are unremarkable. Dextrocurvature of the ?lumbar spine. ? ?IMPRESSION: ?Nonobstructive bowel gas pattern. Moderate colonic stool burden ?diffusely throughout the colon. ? ? ?Electronically Signed ?By: Valentino Saxon MD ?On: 06/15/2020 16:19 ? ?No results found for this or any previous visit. ? ?No results found for this or any previous visit. ? ?No results found for this or any previous visit. ? ?No results found for this or any previous visit. ? ?No results found for this or any previous visit. ? ?No results found for this or any previous visit. ? ?No results found for this or any previous visit. ? ? ?Assessment & Plan:   ? ?1. BPH with LUTS ?-We will trial rapaflo 8mg  daily ?- Urinalysis, Routine w reflex microscopic ?- BLADDER SCAN AMB NON-IMAGING ? ? ?2. Nocturia ?-We will trial rapaflo 8mg  daily ? ? ?No follow-ups on file. ? ?Nicolette Bang, MD ? ?[facility] [place] ?  ?

## 2022-03-25 NOTE — Progress Notes (Signed)
post void residual=54 

## 2022-03-29 ENCOUNTER — Encounter: Payer: Self-pay | Admitting: Urology

## 2022-04-04 NOTE — Telephone Encounter (Signed)
Please advise 

## 2022-04-16 ENCOUNTER — Ambulatory Visit: Payer: 59 | Admitting: Cardiovascular Disease

## 2022-04-23 ENCOUNTER — Encounter: Payer: Self-pay | Admitting: Cardiovascular Disease

## 2022-04-23 ENCOUNTER — Ambulatory Visit (INDEPENDENT_AMBULATORY_CARE_PROVIDER_SITE_OTHER): Payer: 59 | Admitting: Cardiovascular Disease

## 2022-04-23 ENCOUNTER — Other Ambulatory Visit: Payer: 59 | Admitting: Urology

## 2022-04-23 VITALS — BP 122/70 | HR 51 | Ht 63.0 in | Wt 139.4 lb

## 2022-04-23 DIAGNOSIS — E785 Hyperlipidemia, unspecified: Secondary | ICD-10-CM | POA: Insufficient documentation

## 2022-04-23 DIAGNOSIS — Z8249 Family history of ischemic heart disease and other diseases of the circulatory system: Secondary | ICD-10-CM | POA: Diagnosis not present

## 2022-04-23 DIAGNOSIS — R079 Chest pain, unspecified: Secondary | ICD-10-CM | POA: Insufficient documentation

## 2022-04-23 DIAGNOSIS — E782 Mixed hyperlipidemia: Secondary | ICD-10-CM | POA: Diagnosis not present

## 2022-04-23 DIAGNOSIS — R072 Precordial pain: Secondary | ICD-10-CM

## 2022-04-23 DIAGNOSIS — Z720 Tobacco use: Secondary | ICD-10-CM

## 2022-04-23 MED ORDER — METOPROLOL TARTRATE 25 MG PO TABS: 25.0000 mg | ORAL_TABLET | Freq: Once | ORAL | 0 refills | Status: DC

## 2022-04-23 NOTE — Assessment & Plan Note (Signed)
History of ongoing tobacco abuse of 2 packs a day having smoked last 37 years.

## 2022-04-23 NOTE — Patient Instructions (Addendum)
Medication Instructions:  Your physician recommends that you continue on your current medications as directed. Please refer to the Current Medication list given to you today.  *If you need a refill on your cardiac medications before your next appointment, please call your pharmacy*   Lab Work: Your physician recommends that you return for lab work in: next week or 2 for FASTING lipid/liver & BMET  If you have labs (blood work) drawn today and your tests are completely normal, you will receive your results only by: MyChart Message (if you have MyChart) OR A paper copy in the mail If you have any lab test that is abnormal or we need to change your treatment, we will call you to review the results.   Testing/Procedures: Your physician has requested that you have an echocardiogram. Echocardiography is a painless test that uses sound waves to create images of your heart. It provides your doctor with information about the size and shape of your heart and how well your heart's chambers and valves are working. This procedure takes approximately one hour. There are no restrictions for this procedure. This procedure will be done at [facility], 2nd Floor     Follow-Up: At [facility], you and your health needs are our priority.  As part of our continuing mission to provide you with exceptional heart care, we have created designated Provider Care Teams.  These Care Teams include your primary Cardiologist (physician) and Advanced Practice Providers (APPs -  Physician Assistants and Nurse Practitioners) who all work together to provide you with the care you need, when you need it.  We recommend signing up for the patient portal called "MyChart".  Sign up information is provided on this After Visit Summary.  MyChart is used to connect with patients for Virtual Visits (Telemedicine).  Patients are able to view lab/test results, encounter notes, upcoming appointments, etc.  Non-urgent messages  can be sent to your provider as well.   To learn more about what you can do with MyChart, go to [email or web address].    Your next appointment:   4-5 week(s)  The format for your next appointment:   In Person  Provider:   Nanetta Batty, MD    Other Instructions   Your cardiac CT will be scheduled at the below location:   [facility] [address] [place], [place] [address] [phone]  If scheduled at [facility], please arrive at the [facility] and Children's Entrance (Entrance C2) of [facility] 30 minutes prior to test start time. You can use the FREE valet parking offered at entrance C (encouraged to control the heart rate for the test)  Proceed to the [facility] Radiology Department (first floor) to check-in and test prep.  All radiology patients and guests should use entrance C2 at [facility], accessed from [facility], even though the hospital's physical address listed is [address]     Please follow these instructions carefully (unless otherwise directed):  Hold all erectile dysfunction medications at least 3 days (72 hrs) prior to test.  On the Night Before the Test: Be sure to Drink plenty of water. Do not consume any caffeinated/decaffeinated beverages or chocolate 12 hours prior to your test. Do not take any antihistamines 12 hours prior to your test.  On the Day of the Test: Drink plenty of water until 1 hour prior to the test. Do not eat any food 4 hours prior to the test. You may take  your regular medications prior to the test.  Take metoprolol (Lopressor)25mg  two hours prior to test. HOLD Furosemide/Hydrochlorothiazide morning of the test.       After the Test: Drink plenty of water. After receiving IV contrast, you may experience a mild flushed feeling. This is normal. On occasion, you may experience a mild rash up to 24 hours after the test. This is not dangerous. If this occurs,  you can take Benadryl 25 mg and increase your fluid intake. If you experience trouble breathing, this can be serious. If it is severe call 911 IMMEDIATELY. If it is mild, please call our office. If you take any of these medications: Glipizide/Metformin, Avandament, Glucavance, please do not take 48 hours after completing test unless otherwise instructed.  We will call to schedule your test 2-4 weeks out understanding that some insurance companies will need an authorization prior to the service being performed.   For non-scheduling related questions, please contact the cardiac imaging nurse navigator should you have any questions/concerns: Rockwell Alexandria, Cardiac Imaging Nurse Navigator Larey Brick, Cardiac Imaging Nurse Navigator [facility] Direct Office Dial: [phone]   For scheduling needs, including cancellations and rescheduling, please call [place], [phone].

## 2022-04-23 NOTE — Assessment & Plan Note (Signed)
Father died of a myocardial infarction at age 48.

## 2022-04-23 NOTE — Progress Notes (Signed)
04/23/2022 Thomas Hobbs   02-22-1976  [id number]  Primary Physician Tommie Sams, DO Primary Cardiologist: [name] MD Nicholes Calamity, [place]  HPI:  Thomas Hobbs is a 46 y.o. thin-appearing married Caucasian male father of 4 with no grandchildren referred by Everlene Other DO, his primary care provider, for chest pain.  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  His risk factors include 80 pack-years of tobacco abuse having smoked 2 packs a day for almost 40 years.  He does have untreated hyperlipidemia as well as family history with a father who died of a myocardial infarction at age 27.  He is never had a heart attack or stroke.  Does have GERD.  He developed chest pain 3 years ago which occurs 3-5 times a week lasting hours at a time.   Current Meds  Medication Sig   clonazePAM (KLONOPIN) 1 MG tablet Take 1 tablet (1 mg total) by mouth 2 (two) times daily as needed for anxiety.   pantoprazole (PROTONIX) 40 MG tablet TAKE 1 TABLET DAILY   sertraline (ZOLOFT) 100 MG tablet Take 1 tablet (100 mg total) by mouth daily.     Allergies  Allergen Reactions   Augmentin [Amoxicillin-Pot Clavulanate] Nausea Only   Cefzil [Cefprozil] Itching   Levaquin [Levofloxacin In D5w]     Chest tightness, dizziness, lightheaded, myalgia   Other Other (See Comments)    Patient states he is allergic to an antibiotic, unsure of name - possibly clindamycin (numbness and swelling to one side of face)    Social History   Socioeconomic History   Marital status: Married    Spouse name: Not on file   Number of children: Not on file   Years of education: Not on file   Highest education level: Not on file  Occupational History   Not on file  Tobacco Use   Smoking status: Every Day    Packs/day: 1.50    Types: Cigarettes   Smokeless tobacco: Never  Vaping Use   Vaping Use: Never used  Substance and Sexual Activity   Alcohol use: No    Comment: occ   Drug use:  No   Sexual activity: Yes    Birth control/protection: None  Other Topics Concern   Not on file  Social History Narrative   Not on file   Social Determinants of Health   Financial Resource Strain: Not on file  Food Insecurity: Not on file  Transportation Needs: Not on file  Physical Activity: Not on file  Stress: Not on file  Social Connections: Not on file  Intimate Partner Violence: Not on file     Review of Systems: General: negative for chills, fever, night sweats or weight changes.  Cardiovascular: negative for chest pain, dyspnea on exertion, edema, orthopnea, palpitations, paroxysmal nocturnal dyspnea or shortness of breath Dermatological: negative for rash Respiratory: negative for cough or wheezing Urologic: negative for hematuria Abdominal: negative for nausea, vomiting, diarrhea, bright red blood per rectum, melena, or hematemesis Neurologic: negative for visual changes, syncope, or dizziness All other systems reviewed and are otherwise negative except as noted above.    Blood pressure 122/70, pulse (!) 51, height 5\' 3"  (1.6 m), weight 139 lb 6.4 oz (63.2 kg), SpO2 99 %.  General appearance: alert and no distress Neck: no adenopathy, no carotid bruit, no JVD, supple, symmetrical, trachea midline, and thyroid not enlarged, symmetric, no tenderness/mass/nodules Lungs: clear to auscultation bilaterally Heart: regular  rate and rhythm, S1, S2 normal, no murmur, click, rub or gallop Extremities: extremities normal, atraumatic, no cyanosis or edema Pulses: 2+ and symmetric Skin: Skin color, texture, turgor normal. No rashes or lesions Neurologic: Grossly normal  EKG sinus bradycardia 51 without ST or T wave changes.  Personally reviewed this EKG.  ASSESSMENT AND PLAN:   Tobacco abuse History of ongoing tobacco abuse of 2 packs a day having smoked last 37 years.  Hyperlipidemia History of hyperlipidemia not on statin therapy with lipid profile performed 05/12/2020  revealing total cholesterol 182, LDL 128 and HDL of 26.  I am going to recheck a lipid liver profile.  Chest pain of uncertain etiology History of atypical chest pain which has been going on for the last 3 years occurring 3-5 times a week lasting hours at a time.  Given his family history and risk factors we will plan on performing a coronary CTA.  Family history of heart disease Father died of a myocardial infarction at age 65.     [name] MD FACP,FACC,FAHA, [facility] 04/23/2022 11:22 AM

## 2022-04-23 NOTE — Assessment & Plan Note (Signed)
History of hyperlipidemia not on statin therapy with lipid profile performed 05/12/2020 revealing total cholesterol 182, LDL 128 and HDL of 26.  I am going to recheck a lipid liver profile.

## 2022-04-23 NOTE — Assessment & Plan Note (Signed)
History of atypical chest pain which has been going on for the last 3 years occurring 3-5 times a week lasting hours at a time.  Given his family history and risk factors we will plan on performing a coronary CTA.

## 2022-05-03 LAB — BASIC METABOLIC PANEL
BUN/Creatinine Ratio: 17 (ref 9–20)
BUN: 13 mg/dL (ref 6–24)
CO2: 27 mmol/L (ref 20–29)
Calcium: 9.3 mg/dL (ref 8.7–10.2)
Chloride: 104 mmol/L (ref 96–106)
Creatinine, Ser: 0.78 mg/dL (ref 0.76–1.27)
Glucose: 87 mg/dL (ref 70–99)
Potassium: 4.9 mmol/L (ref 3.5–5.2)
Sodium: 139 mmol/L (ref 134–144)
eGFR: 111 mL/min/{1.73_m2} (ref >59)

## 2022-05-03 LAB — LIPID PANEL
Chol/HDL Ratio: 4.7 ratio (ref 0.0–5.0)
Cholesterol, Total: 196 mg/dL (ref 100–199)
HDL: 42 mg/dL (ref >39)
LDL Chol Calc (NIH): 138 mg/dL — ABNORMAL HIGH (ref 0–99)
Triglycerides: 88 mg/dL (ref 0–149)
VLDL Cholesterol Cal: 16 mg/dL (ref 5–40)

## 2022-05-03 LAB — HEPATIC FUNCTION PANEL
ALT: 12 IU/L (ref 0–44)
AST: 19 IU/L (ref 0–40)
Albumin: 4.5 g/dL (ref 4.0–5.0)
Alkaline Phosphatase: 95 IU/L (ref 44–121)
Bilirubin Total: 0.4 mg/dL (ref 0.0–1.2)
Bilirubin, Direct: <0.1 mg/dL (ref 0.00–0.40)
Total Protein: 6.8 g/dL (ref 6.0–8.5)

## 2022-05-06 ENCOUNTER — Ambulatory Visit (INDEPENDENT_AMBULATORY_CARE_PROVIDER_SITE_OTHER): Payer: 59

## 2022-05-06 DIAGNOSIS — R072 Precordial pain: Secondary | ICD-10-CM

## 2022-05-06 DIAGNOSIS — Z720 Tobacco use: Secondary | ICD-10-CM | POA: Diagnosis not present

## 2022-05-06 DIAGNOSIS — R079 Chest pain, unspecified: Secondary | ICD-10-CM | POA: Diagnosis not present

## 2022-05-06 DIAGNOSIS — Z8249 Family history of ischemic heart disease and other diseases of the circulatory system: Secondary | ICD-10-CM | POA: Diagnosis not present

## 2022-05-06 DIAGNOSIS — E782 Mixed hyperlipidemia: Secondary | ICD-10-CM

## 2022-05-06 LAB — ECHOCARDIOGRAM COMPLETE
Area-P 1/2: 3.6 cm2
MV M vel: 4.56 m/s
MV Peak grad: 83.2 mmHg
S' Lateral: 3.36 cm

## 2022-05-07 ENCOUNTER — Ambulatory Visit: Payer: 59 | Admitting: Cardiovascular Disease

## 2022-05-13 ENCOUNTER — Telehealth: Payer: Self-pay

## 2022-05-13 ENCOUNTER — Ambulatory Visit (INDEPENDENT_AMBULATORY_CARE_PROVIDER_SITE_OTHER): Payer: 59 | Admitting: Urology

## 2022-05-13 VITALS — BP 120/78 | HR 59

## 2022-05-13 DIAGNOSIS — R39198 Other difficulties with micturition: Secondary | ICD-10-CM

## 2022-05-13 DIAGNOSIS — N320 Bladder-neck obstruction: Secondary | ICD-10-CM | POA: Diagnosis not present

## 2022-05-13 LAB — URINALYSIS, ROUTINE W REFLEX MICROSCOPIC
Bilirubin, UA: NEGATIVE
Glucose, UA: NEGATIVE
Ketones, UA: NEGATIVE
Leukocytes,UA: NEGATIVE
Nitrite, UA: NEGATIVE
Protein,UA: NEGATIVE
Specific Gravity, UA: 1.025 (ref 1.005–1.030)
Urobilinogen, Ur: 0.2 mg/dL (ref 0.2–1.0)
pH, UA: 5 (ref 5.0–7.5)

## 2022-05-13 LAB — MICROSCOPIC EXAMINATION
Bacteria, UA: NONE SEEN
Epithelial Cells (non renal): NONE SEEN /hpf (ref 0–10)
Renal Epithel, UA: NONE SEEN /hpf
WBC, UA: NONE SEEN /hpf (ref 0–5)

## 2022-05-13 MED ORDER — SULFAMETHOXAZOLE-TRIMETHOPRIM 800-160 MG PO TABS: 1.0000 | ORAL_TABLET | Freq: Once | ORAL | 0 refills | Status: DC

## 2022-05-13 NOTE — Progress Notes (Signed)
   05/13/22  CC: difficulty urinating   HPI: Mr Bradt is a 45yo here for cystoscopy for difficulty urinating Blood pressure 120/78, pulse (!) 59. NED. A&Ox3.   No respiratory distress   Abd soft, NT, ND Normal phallus with bilateral descended testicles  Cystoscopy Procedure Note  Patient identification was confirmed, informed consent was obtained, and patient was prepped using Betadine solution.  Lidocaine jelly was administered per urethral meatus.     Pre-Procedure: - Inspection reveals a normal caliber ureteral meatus.  Procedure: The flexible cystoscope was introduced without difficulty - No urethral strictures/lesions are present. - Enlarged prostate Tight bladder neck - Tight bladder neck - Bilateral ureteral orifices identified - Bladder mucosa  reveals no ulcers, tumors, or lesions - No bladder stones - No trabeculation    Post-Procedure: - Patient tolerated the procedure well  Assessment/ Plan: We discussed the management of his BPH including continued medical therapy, Rezum, Urolift, TUIP, TURP and simple prostatectomy. After discussing the options the patient has elected to proceed with TUIP. Risks/benefits/alternatives discussed.   No follow-ups on file.  Wilkie Aye, MD

## 2022-05-16 ENCOUNTER — Encounter: Payer: Self-pay | Admitting: Urology

## 2022-05-16 NOTE — Telephone Encounter (Signed)
Please advise on surgery side effects

## 2022-05-17 ENCOUNTER — Telehealth: Payer: Self-pay

## 2022-05-17 ENCOUNTER — Inpatient Hospital Stay (HOSPITAL_COMMUNITY): Admission: RE | Admit: 2022-05-17 | Payer: 59 | Source: Ambulatory Visit

## 2022-05-17 NOTE — Telephone Encounter (Signed)
Cardiology clearance letter sent to Dr. Allyson Sabal.  Surgery postponed- my chart message sent to patient. Unsuccessful to reach by phone.

## 2022-05-17 NOTE — Telephone Encounter (Signed)
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  His father died of a MI at the age of 30.  Patient has been seen by Dr Allyson Sabal for chest pain and has a Coronary CTA scheduled for 05/30/2022.  His TURP is for 05/23/2022   Thanks,  Cliffton Asters RN

## 2022-05-22 NOTE — Telephone Encounter (Signed)
Patient has OV with cardiology 07/11

## 2022-05-27 ENCOUNTER — Other Ambulatory Visit: Payer: Self-pay | Admitting: Family Medicine

## 2022-05-27 ENCOUNTER — Ambulatory Visit: Payer: 59 | Admitting: Physician Assistant

## 2022-05-27 DIAGNOSIS — K2100 Gastro-esophageal reflux disease with esophagitis, without bleeding: Secondary | ICD-10-CM

## 2022-05-28 ENCOUNTER — Ambulatory Visit: Payer: No Typology Code available for payment source | Admitting: Family Medicine

## 2022-05-28 ENCOUNTER — Ambulatory Visit (INDEPENDENT_AMBULATORY_CARE_PROVIDER_SITE_OTHER): Payer: Self-pay | Admitting: Cardiovascular Disease

## 2022-05-28 ENCOUNTER — Encounter: Payer: Self-pay | Admitting: Cardiovascular Disease

## 2022-05-28 VITALS — BP 107/71 | HR 70 | Ht 63.0 in | Wt 140.6 lb

## 2022-05-28 DIAGNOSIS — R0789 Other chest pain: Secondary | ICD-10-CM

## 2022-05-28 NOTE — Progress Notes (Signed)
Thomas Hobbs returns today for follow-up of chest pain.  Fortunately, he returns before his coronary CTA was performed and therefore unnecessarily.  His 2D echo was normal.  I will see him back in August after his CT has been performed and resulted.  [name], M.D., FACP, [facility], Earl Lagos [facility] [address] [place], [place]  [address]  [phone] 05/28/2022 9:31 AM

## 2022-05-28 NOTE — Patient Instructions (Signed)
Medication Instructions:  Your physician recommends that you continue on your current medications as directed. Please refer to the Current Medication list given to you today.  *If you need a refill on your cardiac medications before your next appointment, please call your pharmacy*   Follow-Up: At [facility], you and your health needs are our priority.  As part of our continuing mission to provide you with exceptional heart care, we have created designated Provider Care Teams.  These Care Teams include your primary Cardiologist (physician) and Advanced Practice Providers (APPs -  Physician Assistants and Nurse Practitioners) who all work together to provide you with the care you need, when you need it.  We recommend signing up for the patient portal called "MyChart".  Sign up information is provided on this After Visit Summary.  MyChart is used to connect with patients for Virtual Visits (Telemedicine).  Patients are able to view lab/test results, encounter notes, upcoming appointments, etc.  Non-urgent messages can be sent to your provider as well.   To learn more about what you can do with MyChart, go to [email or web address].    Your next appointment:   Friday, August 4th at 10am   Provider:   Nanetta Batty, MD

## 2022-05-29 ENCOUNTER — Ambulatory Visit: Payer: 59 | Admitting: Physician Assistant

## 2022-05-29 ENCOUNTER — Telehealth (HOSPITAL_COMMUNITY): Payer: Self-pay | Admitting: Emergency Medicine

## 2022-05-29 NOTE — Telephone Encounter (Signed)
Attempted to call patient regarding upcoming cardiac CT appointment. °Left message on voicemail with name and callback number °Elly Haffey RN Navigator Cardiac Imaging °[facility] °[phone] Office °[phone] Cell ° °

## 2022-05-30 ENCOUNTER — Ambulatory Visit (HOSPITAL_COMMUNITY)
Admission: RE | Admit: 2022-05-30 | Discharge: 2022-05-30 | Disposition: A | Discharge status: Home / Self Care | Payer: 59 | Source: Ambulatory Visit | Attending: Cardiovascular Disease | Admitting: Cardiovascular Disease

## 2022-05-30 DIAGNOSIS — R072 Precordial pain: Secondary | ICD-10-CM | POA: Diagnosis present

## 2022-05-30 MED ORDER — NITROGLYCERIN 0.4 MG SL SUBL
SUBLINGUAL_TABLET | SUBLINGUAL | Status: AC
  Filled 2022-05-30: qty 2

## 2022-05-30 MED ORDER — NITROGLYCERIN 0.4 MG SL SUBL
0.8000 mg | SUBLINGUAL_TABLET | Freq: Once | SUBLINGUAL | Status: AC
  Administered 2022-05-30: 0.8 mg via SUBLINGUAL

## 2022-05-30 MED ORDER — IOHEXOL 350 MG/ML SOLN
100.0000 mL | Freq: Once | INTRAVENOUS | Status: AC | PRN
  Administered 2022-05-30: 100 mL via INTRAVENOUS

## 2022-06-02 NOTE — Telephone Encounter (Signed)
New cardiology appt 08/04

## 2022-06-04 ENCOUNTER — Encounter (HOSPITAL_COMMUNITY): Admission: RE | Admit: 2022-06-04 | Payer: 59 | Source: Ambulatory Visit

## 2022-06-04 ENCOUNTER — Telehealth: Payer: Self-pay

## 2022-06-04 NOTE — Telephone Encounter (Signed)
   Pre-operative Risk Assessment    Patient Name: ZETH BUDAY  DOB: February 26, 1976 MRN: [id number]      Request for Surgical Clearance    Procedure:   TUIP  Date of Surgery:  Clearance TBD                                 Surgeon:  Dr. Wilkie Aye Surgeon's Group or Practice Name:  [facility] Phone number:  [phone] Fax number:  [phone]   Type of Clearance Requested:   - Medical    Type of Anesthesia:  General    Additional requests/questions:  Please advise surgeon/provider what medications should be held.  Signed, Irena Cords Abdulahi Schor   06/04/2022, 2:28 PM

## 2022-06-05 NOTE — Telephone Encounter (Signed)
   Patient Name: Thomas Hobbs  DOB: January 19, 1976 MRN: [id number]  Primary Cardiologist: None  Chart reviewed as part of pre-operative protocol coverage.  Patient has an upcoming visit scheduled with Dr. Allyson Sabal on 06/21/2022 at which time clearance can be addressed in case there are any issues that would impact surgical recommendations.  TUIP date TBD as below.  I added pre-op FYI to appointment and so the patient [name] is aware to address at time of outpatient visit.  Per office protocol, the cardiology provider should forward their finalize clearance decision and recommendations regarding antiplatelet therapy to the requesting party below.  I will route this message as FYI to the requesting party moving his from the preop box a separate pre-op APP input not needed at this time.    Please call with questions.  Joylene Grapes, NP 06/05/2022, 4:39 PM

## 2022-06-06 ENCOUNTER — Encounter (HOSPITAL_COMMUNITY): Admission: RE | Payer: Self-pay | Source: Home / Self Care

## 2022-06-06 ENCOUNTER — Ambulatory Visit (HOSPITAL_COMMUNITY): Admission: RE | Admit: 2022-06-06 | Payer: 59 | Source: Home / Self Care | Admitting: Urology

## 2022-06-06 SURGERY — CYSTOSCOPY
Anesthesia: General

## 2022-06-21 ENCOUNTER — Encounter: Payer: Self-pay | Admitting: Cardiovascular Disease

## 2022-06-21 ENCOUNTER — Ambulatory Visit (INDEPENDENT_AMBULATORY_CARE_PROVIDER_SITE_OTHER): Payer: 59 | Admitting: Cardiovascular Disease

## 2022-06-21 VITALS — BP 114/68 | HR 64 | Ht 63.0 in | Wt 139.4 lb

## 2022-06-21 DIAGNOSIS — Z01818 Encounter for other preprocedural examination: Secondary | ICD-10-CM | POA: Diagnosis not present

## 2022-06-21 NOTE — Patient Instructions (Signed)

## 2022-06-21 NOTE — Progress Notes (Signed)
Thomas Hobbs returns today for cardiac clearance for an upcoming urologic procedure which he has subsequent decided not to pursue.  His coronary calcium score was 0 and his 2D echo was entirely normal.  I reassured him I will see him back as needed.  He is cleared for any surgical procedure which he needs in the upcoming future from a cardiovascular point of view.  [name], M.D., FACP, [name], [name] [name], [name]  [phone] 06/21/2022 10:41 AM

## 2022-07-04 NOTE — Telephone Encounter (Signed)
Returned call to patient to discuss future surgery date after seeing cardiology. Message left to return call.

## 2022-08-07 ENCOUNTER — Encounter: Payer: Self-pay | Admitting: Nurse Practitioner

## 2022-08-07 ENCOUNTER — Other Ambulatory Visit: Payer: Self-pay | Admitting: Nurse Practitioner

## 2022-08-07 ENCOUNTER — Ambulatory Visit (INDEPENDENT_AMBULATORY_CARE_PROVIDER_SITE_OTHER): Payer: 59 | Admitting: Nurse Practitioner

## 2022-08-07 ENCOUNTER — Ambulatory Visit (HOSPITAL_COMMUNITY)
Admission: RE | Admit: 2022-08-07 | Discharge: 2022-08-07 | Disposition: A | Discharge status: Home / Self Care | Payer: 59 | Source: Ambulatory Visit | Attending: Nurse Practitioner | Admitting: Nurse Practitioner

## 2022-08-07 VITALS — BP 126/77 | Temp 98.4°F | Ht 63.0 in | Wt 135.6 lb

## 2022-08-07 DIAGNOSIS — R051 Acute cough: Secondary | ICD-10-CM

## 2022-08-07 DIAGNOSIS — J439 Emphysema, unspecified: Secondary | ICD-10-CM | POA: Insufficient documentation

## 2022-08-07 MED ORDER — PREDNISONE 20 MG PO TABS: 40.0000 mg | ORAL_TABLET | Freq: Every day | ORAL | 0 refills | Status: AC

## 2022-08-07 MED ORDER — ALBUTEROL SULFATE HFA 108 (90 BASE) MCG/ACT IN AERS: 2.0000 | INHALATION_SPRAY | Freq: Four times a day (QID) | RESPIRATORY_TRACT | 2 refills | Status: DC | PRN

## 2022-08-07 MED ORDER — BENZONATATE 100 MG PO CAPS: 100.0000 mg | ORAL_CAPSULE | Freq: Two times a day (BID) | ORAL | 0 refills | Status: DC | PRN

## 2022-08-07 NOTE — Progress Notes (Signed)
Called patient at number on file. Name and DOB verified.   CXR suggest COPD. No pneumonia noted on exam.   Albuterol, prednisone, and benzonatate ordered. Referral to pulmonology also placed.  Barbee Shropshire

## 2022-08-07 NOTE — Progress Notes (Unsigned)
   Subjective:    Patient ID: Thomas Hobbs, male    DOB: 1976/09/28, 46 y.o.   MRN: [id number]  Cough The current episode started more than 1 month ago. Associated symptoms include nasal congestion.      Review of Systems  Respiratory:  Positive for cough.        Objective:   Physical Exam        Assessment & Plan:

## 2022-08-08 ENCOUNTER — Encounter: Payer: Self-pay | Admitting: Nurse Practitioner

## 2022-08-09 LAB — SPECIMEN STATUS REPORT

## 2022-08-09 LAB — COVID-19, FLU A+B AND RSV
Influenza A, NAA: NOT DETECTED
Influenza B, NAA: NOT DETECTED
RSV, NAA: NOT DETECTED
SARS-CoV-2, NAA: NOT DETECTED

## 2022-08-12 ENCOUNTER — Other Ambulatory Visit: Payer: Self-pay | Admitting: *Deleted

## 2022-08-12 DIAGNOSIS — F418 Other specified anxiety disorders: Secondary | ICD-10-CM

## 2022-08-12 MED ORDER — CLONAZEPAM 1 MG PO TABS: 1.0000 mg | ORAL_TABLET | Freq: Two times a day (BID) | ORAL | 0 refills | Status: DC | PRN

## 2022-08-14 ENCOUNTER — Telehealth: Payer: Self-pay

## 2022-08-14 ENCOUNTER — Other Ambulatory Visit: Payer: Self-pay | Admitting: Family Medicine

## 2022-08-14 NOTE — Telephone Encounter (Signed)
Coral Spikes, DO     This was sent by Dr. Nicki Reaper on 9/25.

## 2022-08-14 NOTE — Telephone Encounter (Signed)
Patient notified

## 2022-08-14 NOTE — Telephone Encounter (Signed)
Caller name:Barth [place]   On DPR? :Yes  Call back [place]  Provider they see: Lacinda Axon    Reason for call:Pt is calling for clonazePAM (KLONOPIN) 1 MG tablet he is out and took his last pill today and is wanting to know does he need a med check appt if not can he get medication sent to  [id number] - [place], [place] - [address]

## 2022-09-23 ENCOUNTER — Encounter: Payer: Self-pay | Admitting: Internal Medicine

## 2022-09-23 ENCOUNTER — Ambulatory Visit: Payer: 59 | Admitting: Internal Medicine

## 2022-09-23 VITALS — BP 122/68 | HR 62 | Temp 98.2°F | Ht 63.0 in | Wt 136.6 lb

## 2022-09-23 DIAGNOSIS — J439 Emphysema, unspecified: Secondary | ICD-10-CM | POA: Diagnosis not present

## 2022-09-23 DIAGNOSIS — F17210 Nicotine dependence, cigarettes, uncomplicated: Secondary | ICD-10-CM

## 2022-09-23 NOTE — Patient Instructions (Addendum)
Try to start cutting down on smoking and see your dentist   Please remember to go to the lab department   for your tests - we will call you with the results when they are available.     Please schedule a follow up office visit in 6 weeks, call sooner if needed in [place] for PFTs same day

## 2022-09-23 NOTE — Assessment & Plan Note (Addendum)
Active smoker - See CT chest 05/30/22 no significant emphysema on Coronary study  Although his cxr is c/w copd, clinically this is mild and there is very little to supper emphysema on CT, though he is at risk - the issue anyway is lung function at this point and for this he will need to return for full pfts but I suspect the copd is quite mild .

## 2022-09-23 NOTE — Progress Notes (Signed)
Thomas Hobbs, male    DOB: [birth year]    MRN: [id number]   Brief patient profile:  43  yowm active smoker works maint in [place]   referred to pulmonary clinic in [place]  09/23/2022 by Barbee Shropshire Ameduite NP  for copd eval.     History of Present Illness  09/23/2022  Pulmonary/ 1st office eval/ [facility] / [facility]  2ppd/ no inhalers  Chief Complaint  Patient presents with   Consult    Potential COPD- cough  Active smoker 30 years 1.5-2 ppd    Dyspnea: MMRC1 = can walk nl pace, flat grade, can't hurry or go uphills or steps s sob  - gets winded playing with  Cough: new onset x one month prior to OV  rx pred helped / slt yellowish but better to his satisfaction at this point  Sleep: some hs cough  but doesn't keep him up  SABA use: none  ? Helped in past  02: none   No obvious day to day or daytime pattern/variability  mucus plugs or hemoptysis or cp or chest tightness, subjective wheeze or overt sinus or hb symptoms.   Sleeping  without nocturnal  or early am exacerbation  of respiratory  c/o's or need for noct saba. Also denies any obvious fluctuation of symptoms with weather or environmental changes or other aggravating or alleviating factors except as outlined above   No unusual exposure hx or h/o childhood pna/ asthma or knowledge of premature birth.  Current Allergies, Complete Past Medical History, Past Surgical History, Family History, and Social History were reviewed in [employer] record.  ROS  The following are not active complaints unless bolded Hoarseness, sore throat, dysphagia, dental problems, itching, sneezing,  nasal congestion or discharge of excess mucus or purulent secretions, ear ache,   fever, chills, sweats, unintended wt loss or wt gain, classically pleuritic or exertional cp,  orthopnea pnd or arm/hand swelling  or leg swelling, presyncope, palpitations, abdominal pain, anorexia, nausea, vomiting, diarrhea  or change in bowel habits or  change in bladder habits, change in stools or change in urine, dysuria, hematuria,  rash, arthralgias, visual complaints, headache, numbness, weakness or ataxia or problems with walking or coordination,  change in mood or  memory.             Past Medical History:  Diagnosis Date   Depression    GERD (gastroesophageal reflux disease)    Hydrocele     Outpatient Medications Prior to Visit  Medication Sig Dispense Refill   clonazePAM (KLONOPIN) 1 MG tablet Take 1 tablet (1 mg total) by mouth 2 (two) times daily as needed for anxiety. 60 tablet 0   pantoprazole (PROTONIX) 40 MG tablet TAKE 1 TABLET DAILY 90 tablet 1   sertraline (ZOLOFT) 100 MG tablet Take 1 tablet (100 mg total) by mouth daily. 90 tablet 3   albuterol (VENTOLIN HFA) 108 (90 Base) MCG/ACT inhaler Inhale 2 puffs into the lungs every 6 (six) hours as needed for wheezing or shortness of breath. 8 g 2   benzonatate (TESSALON) 100 MG capsule Take 1 capsule (100 mg total) by mouth 2 (two) times daily as needed for cough. 20 capsule 0   Lidocaine, Anorectal, (HEMORRHOIDAL RELIEF EX) Apply 1 application  topically daily as needed (hemorrhoids).     No facility-administered medications prior to visit.     Objective:     BP 122/68   Pulse 62   Temp 98.2 F (36.8 C)  Ht 5\' 3"  (1.6 m)   Wt 136 lb 9.6 oz (62 kg)   SpO2 99% Comment: ra  BMI 24.20 kg/m   SpO2: 99 % (ra)  Amb wm nad   HEENT : Oropharynx clear/ poor dentition   Nasal turbinates nl   NECK :  without  apparent JVD/ palpable Nodes/TM   LUNGS: no acc muscle use,  Min barrel  contour chest wall with bilateral  slightly decreased bs s audible wheeze and  without cough on insp or exp maneuvers and min  Hyperresonant  to  percussion bilaterally    CV:  RRR  no s3 or murmur or increase in P2, and no edema   ABD:  soft and nontender with pos end  insp Hoover's  in the supine position.  No bruits or organomegaly appreciated   MS:  Nl gait/ ext warm without  deformities Or obvious joint restrictions  calf tenderness, cyanosis or clubbing     SKIN: warm and dry without lesions    NEURO:  alert, approp, nl sensorium with  no motor or cerebellar deficits apparent.         I personally reviewed images and agree with radiology impression as follows:  CXR:   pa and lateral  08/07/22  COPD. There are no signs of pulmonary edema or focal pulmonary consolidation. dextroscoliosis in lower thoracic and upper lumbar spine.   I personally reviewed images and agree with radiology impression as follows:   Chest CT cuts on coronary study  05/30/22 No confluent opacities or effusions. Minimal dependent atelectasis in the lower lobes.    Assessment   Pulmonary emphysema (HCC) Active smoker - See CT chest 05/30/22 no significant emphysema on Coronary study  Although his cxr is c/w copd, clinically this is mild and there is very little to supper emphysema on CT, though he is at risk - the issue anyway is lung function at this point and for this he will need to return for full pfts but I suspect the copd is quite mild .  Cigarette smoker It does appear he may be developing a pattern of CB from smoking but in the absence of more evidence of AB the main rx at this point is stop smoking and return for pfts before and after saba but hold off on empirical rx for now  Discussed in detail all the  indications, usual  risks and alternatives  relative to the benefits with patient who agrees to proceed with w/u as outlined.        Each maintenance medication was reviewed in detail including emphasizing most importantly the difference between maintenance and prns and under what circumstances the prns are to be triggered using an action plan format where appropriate.  Total time for H and P, chart review, counseling, reviewing hfa device(s) and generating customized AVS unique to this office visit / same day charting = 40 min with pt new to me           06/01/22, MD 09/23/2022

## 2022-09-23 NOTE — Assessment & Plan Note (Addendum)
It does appear he may be developing a pattern of CB from smoking but in the absence of more evidence of AB the main rx at this point is stop smoking and return for pfts before and after saba but hold off on empirical rx for now  Discussed in detail all the  indications, usual  risks and alternatives  relative to the benefits with patient who agrees to proceed with w/u as outlined.        Each maintenance medication was reviewed in detail including emphasizing most importantly the difference between maintenance and prns and under what circumstances the prns are to be triggered using an action plan format where appropriate.  Total time for H and P, chart review, counseling, reviewing hfa device(s) and generating customized AVS unique to this office visit / same day charting = 40 min with pt new to me

## 2022-09-27 LAB — CBC WITH DIFFERENTIAL/PLATELET
Basophils Absolute: 0.1 10*3/uL (ref 0.0–0.2)
Basos: 2 %
EOS (ABSOLUTE): 0.3 10*3/uL (ref 0.0–0.4)
Eos: 5 %
Hematocrit: 45.3 % (ref 37.5–51.0)
Hemoglobin: 14.8 g/dL (ref 13.0–17.7)
Immature Grans (Abs): 0 10*3/uL (ref 0.0–0.1)
Immature Granulocytes: 0 %
Lymphocytes Absolute: 2.3 10*3/uL (ref 0.7–3.1)
Lymphs: 32 %
MCH: 30.3 pg (ref 26.6–33.0)
MCHC: 32.7 g/dL (ref 31.5–35.7)
MCV: 93 fL (ref 79–97)
Monocytes Absolute: 0.5 10*3/uL (ref 0.1–0.9)
Monocytes: 7 %
Neutrophils Absolute: 4.1 10*3/uL (ref 1.4–7.0)
Neutrophils: 54 %
Platelets: 264 10*3/uL (ref 150–450)
RBC: 4.88 x10E6/uL (ref 4.14–5.80)
RDW: 12.8 % (ref 11.6–15.4)
WBC: 7.4 10*3/uL (ref 3.4–10.8)

## 2022-09-27 LAB — ALPHA-1-ANTITRYPSIN PHENOTYP: A-1 Antitrypsin: 162 mg/dL (ref 101–187)

## 2022-10-01 ENCOUNTER — Encounter: Payer: Self-pay | Admitting: Family Medicine

## 2022-10-08 ENCOUNTER — Telehealth: Payer: Self-pay | Admitting: Family Medicine

## 2022-10-08 NOTE — Telephone Encounter (Signed)
Patient has appointment on 11/28 for medication followup but only has 3 pills left and needing enough pills of sertraline 100 mg until seen. [facility]

## 2022-10-09 ENCOUNTER — Other Ambulatory Visit: Payer: Self-pay | Admitting: Family Medicine

## 2022-10-09 DIAGNOSIS — F418 Other specified anxiety disorders: Secondary | ICD-10-CM

## 2022-10-14 NOTE — Telephone Encounter (Signed)
Med was refilled 10/09/22 for enough till appointment

## 2022-10-15 ENCOUNTER — Ambulatory Visit: Payer: 59 | Admitting: Family Medicine

## 2022-10-15 DIAGNOSIS — Z8249 Family history of ischemic heart disease and other diseases of the circulatory system: Secondary | ICD-10-CM

## 2022-10-15 DIAGNOSIS — K2100 Gastro-esophageal reflux disease with esophagitis, without bleeding: Secondary | ICD-10-CM | POA: Diagnosis not present

## 2022-10-15 DIAGNOSIS — F418 Other specified anxiety disorders: Secondary | ICD-10-CM

## 2022-10-15 DIAGNOSIS — Z720 Tobacco use: Secondary | ICD-10-CM | POA: Diagnosis not present

## 2022-10-15 DIAGNOSIS — R351 Nocturia: Secondary | ICD-10-CM

## 2022-10-15 DIAGNOSIS — N401 Enlarged prostate with lower urinary tract symptoms: Secondary | ICD-10-CM | POA: Diagnosis not present

## 2022-10-15 DIAGNOSIS — E782 Mixed hyperlipidemia: Secondary | ICD-10-CM

## 2022-10-15 MED ORDER — CLONAZEPAM 1 MG PO TABS: 1.0000 mg | ORAL_TABLET | Freq: Two times a day (BID) | ORAL | 0 refills | Status: DC | PRN

## 2022-10-15 MED ORDER — SERTRALINE HCL 100 MG PO TABS: 100.0000 mg | ORAL_TABLET | Freq: Every day | ORAL | 3 refills | Status: DC

## 2022-10-15 MED ORDER — PANTOPRAZOLE SODIUM 40 MG PO TBEC: 40.0000 mg | DELAYED_RELEASE_TABLET | Freq: Every day | ORAL | 3 refills | Status: DC

## 2022-10-15 NOTE — Patient Instructions (Signed)
Continue your medications.  Consider second opinion at [facility].  Follow up in 6 months.  Take care  Dr. Adriana Simas

## 2022-10-16 NOTE — Progress Notes (Signed)
Subjective:  Patient ID: Thomas Hobbs, male    DOB: Jun 17, 1976  Age: 46 y.o. MRN: [id number]  CC: Chief Complaint  Patient presents with   Depression    Follow up- doing well on current meds- no problems or concerns    HPI:  46 year old male with the below mentioned medical problems presents for follow up.  Recently had worsening of his mood for unknown reason. Now feeling okay. Needs refill on Zoloft and Klonopin.   Still having issues with BPH, nocturia and incontinence. Has been seen by Urology. Surgery recommend and patient decided against it.   GERD stable on Protonix.   Continues to smoke. Not interested in cessation at this time.   Patient Active Problem List   Diagnosis Date Noted   Pulmonary emphysema (HCC) 08/07/2022   Hyperlipidemia 04/23/2022   Family history of heart disease 04/23/2022   Tobacco abuse 04/23/2022   BPH (benign prostatic hyperplasia) 02/13/2022   GERD (gastroesophageal reflux disease) 11/05/2014   Depression with anxiety 02/13/2013    Social Hx   Social History   Socioeconomic History   Marital status: Married    Spouse name: Not on file   Number of children: Not on file   Years of education: Not on file   Highest education level: Not on file  Occupational History   Not on file  Tobacco Use   Smoking status: Every Day    Packs/day: 1.50    Types: Cigarettes   Smokeless tobacco: Never  Vaping Use   Vaping Use: Never used  Substance and Sexual Activity   Alcohol use: No    Comment: occ   Drug use: No   Sexual activity: Yes    Birth control/protection: None  Other Topics Concern   Not on file  Social History Narrative   Not on file   Social Determinants of Health   Financial Resource Strain: Not on file  Food Insecurity: Not on file  Transportation Needs: Not on file  Physical Activity: Not on file  Stress: Not on file  Social Connections: Not on file    Review of Systems Per HPI  Objective:  BP 123/73   Ht 5'  3" (1.6 m)   Wt 139 lb (63 kg)   BMI 24.62 kg/m      10/15/2022    3:12 PM 09/23/2022   10:13 AM 08/07/2022   11:29 AM  BP/Weight  Systolic BP 123 122 126  Diastolic BP 73 68 77  Wt. (Lbs) 139 136.6 135.6  BMI 24.62 kg/m2 24.2 kg/m2 24.02 kg/m2    Physical Exam Vitals and nursing note reviewed.  Constitutional:      General: He is not in acute distress.    Appearance: Normal appearance.  HENT:     Head: Normocephalic and atraumatic.  Eyes:     General:        Right eye: No discharge.        Left eye: No discharge.     Conjunctiva/sclera: Conjunctivae normal.  Cardiovascular:     Rate and Rhythm: Normal rate and regular rhythm.  Pulmonary:     Effort: Pulmonary effort is normal.     Breath sounds: Normal breath sounds. No wheezing or rales.  Neurological:     Mental Status: He is alert.  Psychiatric:        Mood and Affect: Mood normal.        Behavior: Behavior normal.     Lab Results  Component Value  Date   WBC 7.4 09/23/2022   HGB 14.8 09/23/2022   HCT 45.3 09/23/2022   PLT 264 09/23/2022   GLUCOSE 87 05/03/2022   CHOL 196 05/03/2022   TRIG 88 05/03/2022   HDL 42 05/03/2022   LDLCALC 138 (H) 05/03/2022   ALT 12 05/03/2022   AST 19 05/03/2022   NA 139 05/03/2022   K 4.9 05/03/2022   CL 104 05/03/2022   CREATININE 0.78 05/03/2022   BUN 13 05/03/2022   CO2 27 05/03/2022   TSH 1.970 05/12/2020     Assessment & Plan:   Problem List Items Addressed This Visit       Digestive   GERD (gastroesophageal reflux disease)    Stable. Continue Protonix.       Relevant Medications   pantoprazole (PROTONIX) 40 MG tablet     Genitourinary   BPH (benign prostatic hyperplasia)    Advised to reconsider surgical treatment.         Other   Depression with anxiety    Stable. Continue Zoloft and Klonopin. Refilled today.       Relevant Medications   clonazePAM (KLONOPIN) 1 MG tablet   sertraline (ZOLOFT) 100 MG tablet   Tobacco abuse    Advised  cessation. Not interested at this time.       Meds ordered this encounter  Medications   clonazePAM (KLONOPIN) 1 MG tablet    Sig: Take 1 tablet (1 mg total) by mouth 2 (two) times daily as needed for anxiety.    Dispense:  60 tablet    Refill:  0   pantoprazole (PROTONIX) 40 MG tablet    Sig: Take 1 tablet (40 mg total) by mouth daily.    Dispense:  90 tablet    Refill:  3   sertraline (ZOLOFT) 100 MG tablet    Sig: Take 1 tablet (100 mg total) by mouth daily.    Dispense:  90 tablet    Refill:  3    Follow-up:  6 months  [place]

## 2022-10-16 NOTE — Assessment & Plan Note (Signed)
Advised to reconsider surgical treatment.

## 2022-10-16 NOTE — Assessment & Plan Note (Signed)
Stable. Continue Zoloft and Klonopin. Refilled today.

## 2022-10-16 NOTE — Assessment & Plan Note (Signed)
Advised cessation. Not interested at this time.

## 2022-10-16 NOTE — Assessment & Plan Note (Signed)
Stable ?Continue Protonix ?

## 2022-11-21 ENCOUNTER — Other Ambulatory Visit: Payer: Self-pay | Admitting: Family Medicine

## 2022-11-21 DIAGNOSIS — K2100 Gastro-esophageal reflux disease with esophagitis, without bleeding: Secondary | ICD-10-CM

## 2022-11-25 ENCOUNTER — Ambulatory Visit: Payer: 59 | Admitting: Internal Medicine

## 2022-11-27 ENCOUNTER — Other Ambulatory Visit: Payer: Self-pay | Admitting: Family Medicine

## 2022-11-27 DIAGNOSIS — F418 Other specified anxiety disorders: Secondary | ICD-10-CM

## 2023-05-20 ENCOUNTER — Other Ambulatory Visit: Payer: Self-pay | Admitting: Family Medicine

## 2023-05-20 DIAGNOSIS — K2100 Gastro-esophageal reflux disease with esophagitis, without bleeding: Secondary | ICD-10-CM

## 2023-06-16 ENCOUNTER — Other Ambulatory Visit: Payer: Self-pay | Admitting: Family Medicine

## 2023-06-16 DIAGNOSIS — F418 Other specified anxiety disorders: Secondary | ICD-10-CM

## 2023-08-12 ENCOUNTER — Ambulatory Visit: Payer: 59 | Admitting: Family Medicine

## 2023-08-12 VITALS — BP 103/67 | HR 58 | Temp 97.9°F | Wt 134.0 lb

## 2023-08-12 DIAGNOSIS — G8929 Other chronic pain: Secondary | ICD-10-CM

## 2023-08-12 DIAGNOSIS — M545 Low back pain, unspecified: Secondary | ICD-10-CM | POA: Diagnosis not present

## 2023-08-12 MED ORDER — TIZANIDINE HCL 4 MG PO TABS: 4.0000 mg | ORAL_TABLET | Freq: Three times a day (TID) | ORAL | 0 refills | Status: DC | PRN

## 2023-08-12 MED ORDER — MELOXICAM 15 MG PO TABS: 15.0000 mg | ORAL_TABLET | Freq: Every day | ORAL | 0 refills | Status: DC | PRN

## 2023-08-12 NOTE — Progress Notes (Signed)
Subjective:  Patient ID: Thomas Hobbs, male    DOB: 16-Oct-1976  Age: 47 y.o. MRN: [id number]  CC: Back pain   HPI:  47 year old male presents for evaluation of back pain.  Patient reports right-sided low back pain for the past 2 months.  He has recently had some activity that has made this worse.  He has been dragging limbs through the woods.  No radicular symptoms.  Worse with activity.  No relieving factors.  Patient Active Problem List   Diagnosis Date Noted   Low back pain 08/12/2023   Pulmonary emphysema (HCC) 08/07/2022   Hyperlipidemia 04/23/2022   Family history of heart disease 04/23/2022   Tobacco abuse 04/23/2022   BPH (benign prostatic hyperplasia) 02/13/2022   GERD (gastroesophageal reflux disease) 11/05/2014   Depression with anxiety 02/13/2013    Social Hx   Social History   Socioeconomic History   Marital status: Married    Spouse name: Not on file   Number of children: Not on file   Years of education: Not on file   Highest education level: Not on file  Occupational History   Not on file  Tobacco Use   Smoking status: Every Day    Current packs/day: 1.50    Types: Cigarettes   Smokeless tobacco: Never  Vaping Use   Vaping status: Never Used  Substance and Sexual Activity   Alcohol use: No    Comment: occ   Drug use: No   Sexual activity: Yes    Birth control/protection: None  Other Topics Concern   Not on file  Social History Narrative   Not on file   Social Determinants of Health   Financial Resource Strain: Not on file  Food Insecurity: Not on file  Transportation Needs: Not on file  Physical Activity: Not on file  Stress: Not on file  Social Connections: Unknown (04/02/2022)   [facility]   Social Network    Social Network: Not on file    Review of Systems Per HPI  Objective:  BP 103/67   Pulse (!) 58   Temp 97.9 F (36.6 C) (Oral)   Wt 134 lb (60.8 kg)   SpO2 98%   BMI 23.74 kg/m       08/12/2023   10:51 AM 10/15/2022    3:12 PM 09/23/2022   10:13 AM  BP/Weight  Systolic BP 103 123 122  Diastolic BP 67 73 68  Wt. (Lbs) 134 139 136.6  BMI 23.74 kg/m2 24.62 kg/m2 24.2 kg/m2    Physical Exam Vitals and nursing note reviewed.  Constitutional:      General: He is not in acute distress.    Appearance: Normal appearance.  HENT:     Head: Normocephalic and atraumatic.  Cardiovascular:     Rate and Rhythm: Normal rate and regular rhythm.  Pulmonary:     Effort: Pulmonary effort is normal.     Breath sounds: Normal breath sounds.  Musculoskeletal:     Comments: Scoliosis noted.  Tension to the right paraspinal musculature of the lumbar spine.  Neurological:     Mental Status: He is alert.     Lab Results  Component Value Date   WBC 7.4 09/23/2022   HGB 14.8 09/23/2022   HCT 45.3 09/23/2022   PLT 264 09/23/2022   GLUCOSE 87 05/03/2022   CHOL 196 05/03/2022   TRIG 88 05/03/2022   HDL 42 05/03/2022   LDLCALC 138 (H) 05/03/2022   ALT 12  05/03/2022   AST 19 05/03/2022   NA 139 05/03/2022   K 4.9 05/03/2022   CL 104 05/03/2022   CREATININE 0.78 05/03/2022   BUN 13 05/03/2022   CO2 27 05/03/2022   TSH 1.970 05/12/2020     Assessment & Plan:   Problem List Items Addressed This Visit       Other   Low back pain - Primary    Treating with meloxicam and Zanaflex.  Discussed imaging.  Patient elected to wait.      Relevant Medications   meloxicam (MOBIC) 15 MG tablet   tiZANidine (ZANAFLEX) 4 MG tablet    Meds ordered this encounter  Medications   meloxicam (MOBIC) 15 MG tablet    Sig: Take 1 tablet (15 mg total) by mouth daily as needed for pain.    Dispense:  30 tablet    Refill:  0   tiZANidine (ZANAFLEX) 4 MG tablet    Sig: Take 1 tablet (4 mg total) by mouth every 8 (eight) hours as needed for muscle spasms.    Dispense:  30 tablet    Refill:  0    Follow-up:  Return if symptoms worsen or fail to improve.  Everlene Other  DO [facility]

## 2023-08-12 NOTE — Patient Instructions (Signed)
Heat.  Medication as prescribed.  If continues to persist, let me know and I will order the xray.

## 2023-08-12 NOTE — Assessment & Plan Note (Signed)
Treating with meloxicam and Zanaflex.  Discussed imaging.  Patient elected to wait.

## 2023-09-29 ENCOUNTER — Other Ambulatory Visit: Payer: Self-pay | Admitting: Family Medicine

## 2023-09-29 DIAGNOSIS — F418 Other specified anxiety disorders: Secondary | ICD-10-CM

## 2023-10-30 ENCOUNTER — Other Ambulatory Visit: Payer: Self-pay | Admitting: Family Medicine

## 2024-03-11 ENCOUNTER — Ambulatory Visit: Admitting: Physician Assistant

## 2024-03-11 ENCOUNTER — Ambulatory Visit: Payer: Self-pay

## 2024-03-11 ENCOUNTER — Encounter: Payer: Self-pay | Admitting: Physician Assistant

## 2024-03-11 VITALS — BP 128/76 | HR 68 | Temp 98.0°F | Ht 63.0 in | Wt 125.0 lb

## 2024-03-11 DIAGNOSIS — R197 Diarrhea, unspecified: Secondary | ICD-10-CM | POA: Insufficient documentation

## 2024-03-11 MED ORDER — DIPHENOXYLATE-ATROPINE 2.5-0.025 MG PO TABS: 1.0000 | ORAL_TABLET | Freq: Four times a day (QID) | ORAL | 0 refills | Status: DC | PRN

## 2024-03-11 NOTE — Progress Notes (Addendum)
   Acute Office Visit  Subjective:     Patient ID: Thomas Hobbs, male    DOB: 1976/02/05, 48 y.o.   MRN: [id number]   Diarrhea: Patient complains of diarrhea. Onset of diarrhea was 2 weeks ago. Diarrhea is occurring approximately 4-6 times per day. Patient describes diarrhea as watery. Diarrhea has been associated with abdominal pain described as boring.  Patient denies blood in stool, fever, recent antibiotic use, recent travel.  Previous visits for diarrhea: none. Evaluation to date: none. Treatment to date: none       Review of Systems  Constitutional:  Positive for malaise/fatigue. Negative for fever and weight loss.  Gastrointestinal:  Positive for abdominal pain, diarrhea and heartburn. Negative for blood in stool, constipation, melena, nausea and vomiting.        Objective:     BP 128/76   Pulse 68   Temp 98 F (36.7 C)   Ht 5\' 3"  (1.6 m)   Wt 125 lb (56.7 kg)   SpO2 97%   BMI 22.14 kg/m   Physical Exam Constitutional:      Appearance: He is well-developed and normal weight.  HENT:     Head: Normocephalic and atraumatic.  Cardiovascular:     Rate and Rhythm: Normal rate and regular rhythm.     Heart sounds: Normal heart sounds. No murmur heard. Pulmonary:     Effort: Pulmonary effort is normal.     Breath sounds: Normal breath sounds.  Abdominal:     General: Abdomen is flat. Bowel sounds are normal.     Palpations: Abdomen is soft.     Tenderness: There is generalized abdominal tenderness.  Skin:    General: Skin is warm and dry.     Findings: No rash.  Neurological:     General: No focal deficit present.     Mental Status: He is alert and oriented to person, place, and time.  Psychiatric:        Mood and Affect: Mood normal.        Behavior: Behavior normal.     No results found for any visits on 03/11/24.      Assessment & Plan:  Diarrhea, unspecified type Assessment & Plan: Likely self-resolving infection, exam benign. Lomotil  for  persistent diarrhea. Maintain good hydration, frequent small amounts of oral rehydration solution. Recommended increasing water and electrolytes, recommended bland diet. Return for signs of dehydration, urinating less than 3 times in 24 hours.  Return if blood develops in stool or fever lasts longer than 5 days.  Patient to let clinic know if diarrhea persists over the weekend, will do antibiotic therapy at that time.  Orders: -     Diphenoxylate -Atropine ; Take 1 tablet by mouth 4 (four) times daily as needed for diarrhea or loose stools.  Dispense: 30 tablet; Refill: 0   Return if symptoms worsen or fail to improve.  Jearlean Mince Nate Perri, PA-C

## 2024-03-11 NOTE — Telephone Encounter (Signed)
 Chief Complaint: diarrhea Symptoms: abd pain  Disposition: [] ED /[] Urgent Care (no appt availability in office) / [x] Appointment(In office/virtual)/ []  [facility]/ [] Home Care/ [] Refused Recommended Disposition /[] [place] [facility]/ []  Follow-up with PCP Additional Notes: Pt calling with diarrhea concerns over last 2 weeks. Pt typically has diarrhea 3-6x/daily.  Stool is very watery/brown.  Pt stated "it looks some mucus is mixed in." Denies blood. Pt has decreased in appetite. Thought of food makes him nauseated. Pt has lost weight. Pt shows no signs of dehydration. Pt is urinating regularly. Pt does have mild-moderate stomach pain. Described as "dull ache like I've been kicked." Pt has appt this morning @ 11. RN gave care advice and pt verbalized understanding.          Copied from CRM [id number]. Topic: Clinical - Red Word Triage >> Mar 11, 2024  8:29 AM Marissa P wrote: Red Word that prompted transfer to Nurse Triage: Patient is experiencing abdominal pain and everyday diarrhea. Reason for Disposition  [1] MODERATE diarrhea (e.g., 4-6 times / day more than normal) AND [2] present > 48 hours (2 days)  Answer Assessment - Initial Assessment Questions 1. DIARRHEA SEVERITY: "How bad is the diarrhea?" "How many more stools have you had in the past 24 hours than normal?"    - NO DIARRHEA (SCALE 0)   - MILD (SCALE 1-3): Few loose or mushy BMs; increase of 1-3 stools over normal daily number of stools; mild increase in ostomy output.   -  MODERATE (SCALE 4-7): Increase of 4-6 stools daily over normal; moderate increase in ostomy output.   -  SEVERE (SCALE 8-10; OR "WORST POSSIBLE"): Increase of 7 or more stools daily over normal; moderate increase in ostomy output; incontinence.     moderate 2. ONSET: "When did the diarrhea begin?"      2 weeks ago 3. BM CONSISTENCY: "How loose or watery is the diarrhea?"      watery 4. VOMITING: "Are you also vomiting?" If Yes, ask:  "How many times in the past 24 hours?"      denies 5. ABDOMEN PAIN: "Are you having any abdomen pain?" If Yes, ask: "What does it feel like?" (e.g., crampy, dull, intermittent, constant)      dull 6. ABDOMEN PAIN SEVERITY: If present, ask: "How bad is the pain?"  (e.g., Scale 1-10; mild, moderate, or severe)   - MILD (1-3): doesn't interfere with normal activities, abdomen soft and not tender to touch    - MODERATE (4-7): interferes with normal activities or awakens from sleep, abdomen tender to touch    - SEVERE (8-10): excruciating pain, doubled over, unable to do any normal activities       406 7. ORAL INTAKE: If vomiting, "Have you been able to drink liquids?" "How much liquids have you had in the past 24 hours?"     na 8. HYDRATION: "Any signs of dehydration?" (e.g., dry mouth [not just dry lips], too weak to stand, dizziness, new weight loss) "When did you last urinate?"     Weight loss, urinated this morning 9. EXPOSURE: "Have you traveled to a foreign country recently?" "Have you been exposed to anyone with diarrhea?" "Could you have eaten any food that was spoiled?"     denies 10. ANTIBIOTIC USE: "Are you taking antibiotics now or have you taken antibiotics in the past 2 months?"       denies 11. OTHER SYMPTOMS: "Do you have any other symptoms?" (e.g., fever, blood in stool)  Mucus in stool  Protocols used: [facility]

## 2024-03-11 NOTE — Assessment & Plan Note (Addendum)
 Likely self-resolving infection, exam benign. Lomotil  for persistent diarrhea. Maintain good hydration, frequent small amounts of oral rehydration solution. Recommended increasing water and electrolytes, recommended bland diet. Return for signs of dehydration, urinating less than 3 times in 24 hours.  Return if blood develops in stool or fever lasts longer than 5 days.  Patient to let clinic know if diarrhea persists over the weekend, will do antibiotic therapy at that time.

## 2024-03-11 NOTE — Telephone Encounter (Signed)
 Appointment scheduled.

## 2024-03-31 ENCOUNTER — Other Ambulatory Visit: Payer: Self-pay | Admitting: Family Medicine

## 2024-03-31 DIAGNOSIS — F418 Other specified anxiety disorders: Secondary | ICD-10-CM

## 2024-04-26 ENCOUNTER — Encounter: Payer: Self-pay | Admitting: Nurse Practitioner

## 2024-04-26 ENCOUNTER — Ambulatory Visit: Admitting: Nurse Practitioner

## 2024-04-26 VITALS — BP 122/80 | HR 69 | Temp 98.8°F | Ht 63.0 in | Wt 128.4 lb

## 2024-04-26 DIAGNOSIS — J069 Acute upper respiratory infection, unspecified: Secondary | ICD-10-CM

## 2024-04-26 DIAGNOSIS — B9689 Other specified bacterial agents as the cause of diseases classified elsewhere: Secondary | ICD-10-CM

## 2024-04-26 MED ORDER — AZITHROMYCIN 250 MG PO TABS: ORAL_TABLET | ORAL | 0 refills | Status: DC

## 2024-04-26 NOTE — Progress Notes (Signed)
   Subjective:    Patient ID: Thomas Hobbs, male    DOB: Jul 25, 1976, 48 y.o.   MRN: [id number]  HPI Presents for complaints of cough and congestion for the past 8 days.  Facial area sinus pressure.  Spells of coughing now producing green mucus.  No fever.  Postnasal drainage.  Ear pressure.  No sore throat.  No chest pain/tightness or wheezing.  Sensation of shortness of breath but thinks this may be from the head congestion.  Has tried Alka-Seltzer plus Flonase and NyQuil.  Smokes 1-1/2 packs/day on average.  Also of note his GERD has been stable.      Objective:   Physical Exam NAD.  Alert, oriented.  TMs clear effusion, no erythema.  Pharynx mild erythema with PND noted.  Neck supple with mild soft anterior cervical adenopathy.  Lungs clear.  Occasional harsh cough noted.  No tachypnea.  Normal color.  No wheezing.  Heart regular rate rhythm. Today's Vitals   04/26/24 1554  BP: 122/80  Pulse: 69  Temp: 98.8 F (37.1 C)  SpO2: 97%  Weight: 128 lb 6.4 oz (58.2 kg)  Height: 5\' 3"  (1.6 m)   Body mass index is 22.75 kg/m.       Assessment & Plan:  Bacterial URI Meds ordered this encounter  Medications   azithromycin  (ZITHROMAX  Z-PAK) 250 MG tablet    Sig: Take 2 tablets (500 mg) on  Day 1,  followed by 1 tablet (250 mg) once daily on Days 2 through 5.    Dispense:  6 each    Refill:  0    Supervising Provider:   Charlotta Cook A [[id number]]   Continue OTC meds as directed for symptomatic care.  Recommend Mucinex DM or similar medication for daytime use. Discussed the importance of reducing his tobacco use, recommend a goal of half a pack per day over time. Return if symptoms worsen or fail to improve.

## 2024-05-05 ENCOUNTER — Ambulatory Visit: Admitting: Family Medicine

## 2024-05-05 VITALS — BP 113/68 | HR 66 | Temp 98.6°F | Ht 63.0 in | Wt 127.8 lb

## 2024-05-05 DIAGNOSIS — J988 Other specified respiratory disorders: Secondary | ICD-10-CM | POA: Insufficient documentation

## 2024-05-05 MED ORDER — DOXYCYCLINE HYCLATE 100 MG PO TABS: 100.0000 mg | ORAL_TABLET | Freq: Two times a day (BID) | ORAL | 0 refills | Status: DC

## 2024-05-05 NOTE — Progress Notes (Signed)
 Subjective:  Patient ID: Thomas Hobbs, male    DOB: [birth year]  Age: 48 y.o. MRN: [id number]  CC:  Persistent respiratory symptoms   HPI:  48 year old male presents for evaluation of the above.  Seen on 6/9 and started on Azithromycin . Has improved some but still has sinus pressure headache and fatigue. Overall, not feeling well. No fever. Productive cough as well. He is a smoker.  Patient Active Problem List   Diagnosis Date Noted   Respiratory infection 05/05/2024   Diarrhea 03/11/2024   Low back pain 08/12/2023   Pulmonary emphysema (HCC) 08/07/2022   Hyperlipidemia 04/23/2022   Family history of heart disease 04/23/2022   Tobacco abuse 04/23/2022   BPH (benign prostatic hyperplasia) 02/13/2022   GERD (gastroesophageal reflux disease) 11/05/2014   Depression with anxiety 02/13/2013    Social Hx   Social History   Socioeconomic History   Marital status: Married    Spouse name: Not on file   Number of children: Not on file   Years of education: Not on file   Highest education level: Not on file  Occupational History   Not on file  Tobacco Use   Smoking status: Every Day    Current packs/day: 1.50    Types: Cigarettes   Smokeless tobacco: Never  Vaping Use   Vaping status: Never Used  Substance and Sexual Activity   Alcohol use: No    Comment: occ   Drug use: No   Sexual activity: Yes    Birth control/protection: None  Other Topics Concern   Not on file  Social History Narrative   Not on file   Social Drivers of Health   Financial Resource Strain: Not on file  Food Insecurity: Not on file  Transportation Needs: Not on file  Physical Activity: Not on file  Stress: Not on file  Social Connections: Unknown (04/02/2022)   Received from [employer]   Social Network    Social Network: Not on file    Review of Systems Per HPI  Objective:  BP 113/68   Pulse 66   Temp 98.6 F (37 C)   Ht 5' 3 (1.6 m)   Wt 127 lb 12.8 oz (58 kg)   SpO2 96%    BMI 22.64 kg/m      05/05/2024    2:59 PM 04/26/2024    3:54 PM 03/11/2024   11:07 AM  BP/Weight  Systolic BP 113 122 128  Diastolic BP 68 80 76  Wt. (Lbs) 127.8 128.4 125  BMI 22.64 kg/m2 22.75 kg/m2 22.14 kg/m2    Physical Exam Vitals and nursing note reviewed.  Constitutional:      General: He is not in acute distress.    Appearance: Normal appearance.  HENT:     Head: Normocephalic and atraumatic.     Right Ear: Tympanic membrane normal.     Left Ear: Tympanic membrane normal.   Cardiovascular:     Rate and Rhythm: Normal rate and regular rhythm.  Pulmonary:     Effort: Pulmonary effort is normal.     Breath sounds: Normal breath sounds.   Neurological:     Mental Status: He is alert.     Lab Results  Component Value Date   WBC 7.4 09/23/2022   HGB 14.8 09/23/2022   HCT 45.3 09/23/2022   PLT 264 09/23/2022   GLUCOSE 87 05/03/2022   CHOL 196 05/03/2022   TRIG 88 05/03/2022   HDL 42 05/03/2022   LDLCALC  138 (H) 05/03/2022   ALT 12 05/03/2022   AST 19 05/03/2022   NA 139 05/03/2022   K 4.9 05/03/2022   CL 104 05/03/2022   CREATININE 0.78 05/03/2022   BUN 13 05/03/2022   CO2 27 05/03/2022   TSH 1.970 05/12/2020     Assessment & Plan:  Respiratory infection Assessment & Plan: Persistent symptoms. Treating with Doxy.   Other orders -     Doxycycline  Hyclate; Take 1 tablet (100 mg total) by mouth 2 (two) times daily. Take with food and water.  Dispense: 14 tablet; Refill: 0    Follow-up:  Return if symptoms worsen or fail to improve.  Kathleen Papa DO [facility]

## 2024-05-05 NOTE — Assessment & Plan Note (Signed)
 Persistent symptoms. Treating with Doxy.

## 2024-05-14 ENCOUNTER — Other Ambulatory Visit: Payer: Self-pay | Admitting: Family Medicine

## 2024-05-14 ENCOUNTER — Other Ambulatory Visit: Payer: Self-pay

## 2024-05-14 DIAGNOSIS — K2100 Gastro-esophageal reflux disease with esophagitis, without bleeding: Secondary | ICD-10-CM

## 2024-05-14 MED ORDER — PANTOPRAZOLE SODIUM 40 MG PO TBEC: 40.0000 mg | DELAYED_RELEASE_TABLET | Freq: Every day | ORAL | 3 refills | Status: AC

## 2024-06-02 ENCOUNTER — Other Ambulatory Visit: Payer: Self-pay | Admitting: Family Medicine

## 2024-06-02 DIAGNOSIS — F418 Other specified anxiety disorders: Secondary | ICD-10-CM

## 2024-06-10 ENCOUNTER — Encounter: Admitting: Family Medicine

## 2024-06-28 ENCOUNTER — Ambulatory Visit: Admitting: Family Medicine

## 2024-06-28 ENCOUNTER — Encounter: Payer: Self-pay | Admitting: Family Medicine

## 2024-06-28 VITALS — BP 117/70 | HR 56 | Temp 98.1°F | Ht 63.0 in | Wt 131.0 lb

## 2024-06-28 DIAGNOSIS — N529 Male erectile dysfunction, unspecified: Secondary | ICD-10-CM | POA: Diagnosis not present

## 2024-06-28 DIAGNOSIS — Z0001 Encounter for general adult medical examination with abnormal findings: Secondary | ICD-10-CM

## 2024-06-28 DIAGNOSIS — R82998 Other abnormal findings in urine: Secondary | ICD-10-CM

## 2024-06-28 DIAGNOSIS — E782 Mixed hyperlipidemia: Secondary | ICD-10-CM | POA: Diagnosis not present

## 2024-06-28 DIAGNOSIS — Z0000 Encounter for general adult medical examination without abnormal findings: Secondary | ICD-10-CM | POA: Insufficient documentation

## 2024-06-28 DIAGNOSIS — Z130 Encounter for screening for diseases of the blood and blood-forming organs and certain disorders involving the immune mechanism: Secondary | ICD-10-CM

## 2024-06-28 MED ORDER — TADALAFIL 10 MG PO TABS: 10.0000 mg | ORAL_TABLET | Freq: Every day | ORAL | 5 refills | Status: AC | PRN

## 2024-06-28 NOTE — Assessment & Plan Note (Signed)
Trial of Cialis. °

## 2024-06-28 NOTE — Progress Notes (Signed)
 Subjective:  Patient ID: Thomas Hobbs, male    DOB: 06-10-1976  Age: 48 y.o. MRN: [id number]  CC:   Chief Complaint  Patient presents with   Annual Exam    HPI:  48 year old male presents for a physical exam.   BP is well controlled. Continues to smoke. Will discuss today.  Candidate for pneumococcal vaccine. He is amendable to this. However, we are currently out of PCV-20. We do not yet currently have flu shots. Patient desires labs today.   Patient reports continue issue with nocturia/enuresis. Has been seen by Urology. Has BPH and surgery was recommended but patient elected not to proceed due to cost. Also reports foamy urine (like soap).  Patient also reports that his erections are not a firm as he would like. Has tried Viagra  in the past but did not like the way it made him feel.  Patient Active Problem List   Diagnosis Date Noted   Annual physical exam 06/28/2024   Erectile dysfunction 06/28/2024   Foamy urine 06/28/2024   Pulmonary emphysema (HCC) 08/07/2022   Hyperlipidemia 04/23/2022   Family history of heart disease 04/23/2022   Tobacco abuse 04/23/2022   BPH (benign prostatic hyperplasia) 02/13/2022   GERD (gastroesophageal reflux disease) 11/05/2014   Depression with anxiety 02/13/2013    Social Hx   Social History   Socioeconomic History   Marital status: Married    Spouse name: Not on file   Number of children: Not on file   Years of education: Not on file   Highest education level: Not on file  Occupational History   Not on file  Tobacco Use   Smoking status: Every Day    Current packs/day: 1.50    Types: Cigarettes   Smokeless tobacco: Never  Vaping Use   Vaping status: Never Used  Substance and Sexual Activity   Alcohol use: No    Comment: occ   Drug use: No   Sexual activity: Yes    Birth control/protection: None  Other Topics Concern   Not on file  Social History Narrative   Not on file   Social Drivers of Health   Financial  Resource Strain: Not on file  Food Insecurity: Not on file  Transportation Needs: Not on file  Physical Activity: Not on file  Stress: Not on file  Social Connections: Unknown (04/02/2022)   Received from [employer]   Social Network    Social Network: Not on file    Review of Systems Per HPI  Objective:  BP 117/70   Pulse (!) 56   Temp 98.1 F (36.7 C)   Ht 5' 3 (1.6 m)   Wt 131 lb (59.4 kg)   SpO2 99%   BMI 23.21 kg/m      06/28/2024    2:09 PM 05/05/2024    2:59 PM 04/26/2024    3:54 PM  BP/Weight  Systolic BP 117 113 122  Diastolic BP 70 68 80  Wt. (Lbs) 131 127.8 128.4  BMI 23.21 kg/m2 22.64 kg/m2 22.75 kg/m2    Physical Exam Vitals and nursing note reviewed.  Constitutional:      General: He is not in acute distress.    Appearance: Normal appearance.  HENT:     Head: Normocephalic and atraumatic.  Eyes:     General:        Right eye: No discharge.        Left eye: No discharge.     Conjunctiva/sclera: Conjunctivae normal.  Cardiovascular:     Rate and Rhythm: Normal rate and regular rhythm.  Pulmonary:     Effort: Pulmonary effort is normal.     Breath sounds: Normal breath sounds. No wheezing, rhonchi or rales.  Neurological:     Mental Status: He is alert.  Psychiatric:        Mood and Affect: Mood normal.        Behavior: Behavior normal.     Lab Results  Component Value Date   WBC 7.4 09/23/2022   HGB 14.8 09/23/2022   HCT 45.3 09/23/2022   PLT 264 09/23/2022   GLUCOSE 87 05/03/2022   CHOL 196 05/03/2022   TRIG 88 05/03/2022   HDL 42 05/03/2022   LDLCALC 138 (H) 05/03/2022   ALT 12 05/03/2022   AST 19 05/03/2022   NA 139 05/03/2022   K 4.9 05/03/2022   CL 104 05/03/2022   CREATININE 0.78 05/03/2022   BUN 13 05/03/2022   CO2 27 05/03/2022   TSH 1.970 05/12/2020     Assessment & Plan:  Annual physical exam Assessment & Plan: Labs today. Recommended smoking cessation. Amendable to PCV 20 but we are currently out.   Mixed  hyperlipidemia -     Lipid panel  Foamy urine Assessment & Plan: Urine ACR to assess for proteinuria.  Orders: -     CMP14+EGFR -     Microalbumin / creatinine urine ratio  Screening for deficiency anemia -     CBC  Erectile dysfunction, unspecified erectile dysfunction type Assessment & Plan: Trial of Cialis .  Orders: -     Tadalafil ; Take 1 tablet (10 mg total) by mouth daily as needed for erectile dysfunction.  Dispense: 10 tablet; Refill: 5    Follow-up:  6 months  Kolleen Ochsner Bluford DO [facility]

## 2024-06-28 NOTE — Assessment & Plan Note (Signed)
 Labs today. Recommended smoking cessation. Amendable to PCV 20 but we are currently out.

## 2024-06-28 NOTE — Assessment & Plan Note (Signed)
 Urine ACR to assess for proteinuria.

## 2024-06-28 NOTE — Patient Instructions (Signed)
 Labs at your convenience.  Consider seeing Urology again.   Medication as prescribed.  Work on smoking cessation.  Follow up in 6 months.

## 2024-06-29 ENCOUNTER — Ambulatory Visit: Payer: Self-pay | Admitting: Family Medicine

## 2024-06-29 LAB — CMP14+EGFR
ALT: 10 IU/L (ref 0–44)
AST: 18 IU/L (ref 0–40)
Albumin: 4.4 g/dL (ref 4.1–5.1)
Alkaline Phosphatase: 113 IU/L (ref 44–121)
BUN/Creatinine Ratio: 17 (ref 9–20)
BUN: 15 mg/dL (ref 6–24)
Bilirubin Total: 0.2 mg/dL (ref 0.0–1.2)
CO2: 21 mmol/L (ref 20–29)
Calcium: 9.2 mg/dL (ref 8.7–10.2)
Chloride: 101 mmol/L (ref 96–106)
Creatinine, Ser: 0.87 mg/dL (ref 0.76–1.27)
Globulin, Total: 2.3 g/dL (ref 1.5–4.5)
Glucose: 69 mg/dL — ABNORMAL LOW (ref 70–99)
Potassium: 4.9 mmol/L (ref 3.5–5.2)
Sodium: 138 mmol/L (ref 134–144)
Total Protein: 6.7 g/dL (ref 6.0–8.5)
eGFR: 106 mL/min/1.73 (ref >59)

## 2024-06-29 LAB — LIPID PANEL
Chol/HDL Ratio: 4.4 ratio (ref 0.0–5.0)
Cholesterol, Total: 168 mg/dL (ref 100–199)
HDL: 38 mg/dL — ABNORMAL LOW (ref >39)
LDL Chol Calc (NIH): 101 mg/dL — ABNORMAL HIGH (ref 0–99)
Triglycerides: 163 mg/dL — ABNORMAL HIGH (ref 0–149)
VLDL Cholesterol Cal: 29 mg/dL (ref 5–40)

## 2024-06-29 LAB — MICROALBUMIN / CREATININE URINE RATIO
Creatinine, Urine: 126.6 mg/dL
Microalb/Creat Ratio: 3 mg/g{creat} (ref 0–29)
Microalbumin, Urine: 3.3 ug/mL

## 2024-06-29 LAB — CBC
Hematocrit: 44.8 % (ref 37.5–51.0)
Hemoglobin: 14.5 g/dL (ref 13.0–17.7)
MCH: 30.7 pg (ref 26.6–33.0)
MCHC: 32.4 g/dL (ref 31.5–35.7)
MCV: 95 fL (ref 79–97)
Platelets: 270 x10E3/uL (ref 150–450)
RBC: 4.72 x10E6/uL (ref 4.14–5.80)
RDW: 13.1 % (ref 11.6–15.4)
WBC: 8.6 x10E3/uL (ref 3.4–10.8)

## 2024-10-07 ENCOUNTER — Other Ambulatory Visit: Payer: Self-pay | Admitting: Family Medicine

## 2024-12-05 ENCOUNTER — Other Ambulatory Visit: Payer: Self-pay | Admitting: Family Medicine

## 2024-12-05 DIAGNOSIS — F418 Other specified anxiety disorders: Secondary | ICD-10-CM

## 2024-12-06 ENCOUNTER — Other Ambulatory Visit: Payer: Self-pay | Admitting: Family Medicine

## 2024-12-06 DIAGNOSIS — F418 Other specified anxiety disorders: Secondary | ICD-10-CM

## 2024-12-06 MED ORDER — CLONAZEPAM 1 MG PO TABS: ORAL_TABLET | ORAL | 0 refills | Status: AC
